# Patient Record
Sex: Female | Born: 1945 | Race: Black or African American | Hispanic: No | Marital: Married | Smoking: Former smoker
Health system: Southern US, Community
[De-identification: ages and names within clinical notes are randomized; demographics above are authoritative.]

## PROBLEM LIST (undated history)

## (undated) DIAGNOSIS — K219 Gastro-esophageal reflux disease without esophagitis: Secondary | ICD-10-CM

## (undated) DIAGNOSIS — R269 Unspecified abnormalities of gait and mobility: Secondary | ICD-10-CM

## (undated) DIAGNOSIS — E119 Type 2 diabetes mellitus without complications: Secondary | ICD-10-CM

## (undated) DIAGNOSIS — I10 Essential (primary) hypertension: Secondary | ICD-10-CM

## (undated) DIAGNOSIS — F329 Major depressive disorder, single episode, unspecified: Secondary | ICD-10-CM

## (undated) DIAGNOSIS — M1990 Unspecified osteoarthritis, unspecified site: Secondary | ICD-10-CM

## (undated) DIAGNOSIS — G629 Polyneuropathy, unspecified: Secondary | ICD-10-CM

## (undated) DIAGNOSIS — G373 Acute transverse myelitis in demyelinating disease of central nervous system: Secondary | ICD-10-CM

## (undated) DIAGNOSIS — F32A Depression, unspecified: Secondary | ICD-10-CM

## (undated) DIAGNOSIS — E669 Obesity, unspecified: Secondary | ICD-10-CM

## (undated) DIAGNOSIS — E785 Hyperlipidemia, unspecified: Secondary | ICD-10-CM

## (undated) DIAGNOSIS — R55 Syncope and collapse: Secondary | ICD-10-CM

## (undated) DIAGNOSIS — I2510 Atherosclerotic heart disease of native coronary artery without angina pectoris: Secondary | ICD-10-CM

## (undated) HISTORY — PX: OTHER SURGICAL HISTORY: SHX169

## (undated) HISTORY — DX: Unspecified osteoarthritis, unspecified site: M19.90

## (undated) HISTORY — DX: Polyneuropathy, unspecified: G62.9

## (undated) HISTORY — PX: BLADDER REPAIR: SHX76

## (undated) HISTORY — PX: REFRACTIVE SURGERY: SHX103

## (undated) HISTORY — DX: Syncope and collapse: R55

## (undated) HISTORY — DX: Essential (primary) hypertension: I10

## (undated) HISTORY — DX: Type 2 diabetes mellitus without complications: E11.9

## (undated) HISTORY — PX: CATARACT EXTRACTION: SUR2

## (undated) HISTORY — PX: TUMOR REMOVAL: SHX12

## (undated) HISTORY — PX: BREAST LUMPECTOMY: SHX2

## (undated) HISTORY — DX: Unspecified abnormalities of gait and mobility: R26.9

## (undated) HISTORY — DX: Gastro-esophageal reflux disease without esophagitis: K21.9

## (undated) HISTORY — DX: Acute transverse myelitis in demyelinating disease of central nervous system: G37.3

## (undated) HISTORY — DX: Major depressive disorder, single episode, unspecified: F32.9

## (undated) HISTORY — DX: Hyperlipidemia, unspecified: E78.5

## (undated) HISTORY — DX: Depression, unspecified: F32.A

## (undated) HISTORY — DX: Atherosclerotic heart disease of native coronary artery without angina pectoris: I25.10

## (undated) HISTORY — DX: Obesity, unspecified: E66.9

## (undated) HISTORY — PX: TOTAL HIP ARTHROPLASTY: SHX124

## (undated) HISTORY — PX: CARPAL TUNNEL RELEASE: SHX101

---

## 2008-07-21 ENCOUNTER — Encounter: Admission: RE | Admit: 2008-07-21 | Discharge: 2008-07-21 | Payer: Self-pay | Admitting: Neurology

## 2008-09-29 ENCOUNTER — Ambulatory Visit: Payer: Self-pay | Admitting: Urology

## 2008-10-13 ENCOUNTER — Ambulatory Visit: Payer: Self-pay | Admitting: Urology

## 2008-11-10 ENCOUNTER — Ambulatory Visit: Payer: Self-pay | Admitting: Urology

## 2008-11-24 ENCOUNTER — Ambulatory Visit: Payer: Self-pay | Admitting: Urology

## 2009-03-23 ENCOUNTER — Ambulatory Visit: Payer: Self-pay | Admitting: Gastroenterology

## 2009-07-20 ENCOUNTER — Ambulatory Visit: Payer: Self-pay | Admitting: Internal Medicine

## 2010-05-11 ENCOUNTER — Ambulatory Visit (HOSPITAL_BASED_OUTPATIENT_CLINIC_OR_DEPARTMENT_OTHER): Admission: RE | Admit: 2010-05-11 | Discharge: 2010-05-11 | Payer: Self-pay | Admitting: Orthopedic Surgery

## 2010-08-18 ENCOUNTER — Encounter: Payer: Self-pay | Admitting: Cardiovascular Disease

## 2010-09-27 DIAGNOSIS — R55 Syncope and collapse: Secondary | ICD-10-CM | POA: Insufficient documentation

## 2010-10-04 ENCOUNTER — Encounter: Payer: Self-pay | Admitting: Cardiovascular Disease

## 2010-10-04 ENCOUNTER — Ambulatory Visit: Payer: Self-pay | Admitting: Cardiovascular Disease

## 2010-10-04 DIAGNOSIS — I2510 Atherosclerotic heart disease of native coronary artery without angina pectoris: Secondary | ICD-10-CM

## 2010-10-04 DIAGNOSIS — I10 Essential (primary) hypertension: Secondary | ICD-10-CM

## 2010-10-18 ENCOUNTER — Ambulatory Visit: Payer: PRIVATE HEALTH INSURANCE | Admitting: Internal Medicine

## 2010-12-06 NOTE — Assessment & Plan Note (Signed)
Summary: syncope/mt   Visit Type:  new pt visit Referring Provider:  Dr. Renae Fickle Primary Provider:  Yates Decamp  CC:  syncope episode in [place] while visiting....weakness before syncope episode...edema/ankles/hands...denies any cp ...does have some sob at times.  History of Present Illness: 65 yo AAF  with h/o HTN, HL, DM here today for evaluation of syncope. She was visiting her daughter in [place] on August 16, 2010 and was walking down hall when she fell and lost consciousness. She was admitted and ruled out for an MI. A nuclear stress test showed possible inferior wall ischemia. Echo showed normal LV function. Cardiac cath with 50-60% mid LAD lesion with FFR of 0.85.  She was released without any intervention. She tells me that she has had no chest pain. She does have occasional SOB and arm weakness and fatigue. She thinks she took an extra BP pill the day she passed out.   Records reviiewed. I only have a discharge summary from [place], [place]. Cath with 50-60% LAD stenosis and FFR of 0.85. EF normal. Echo with normal EF.   Current Medications (verified): 1)  Metformin Hcl 500 Mg Tabs (Metformin Hcl) .Marland Kitchen.. 1 Tab Two Times A Day 2)  Nexium 40 Mg Cpdr (Esomeprazole Magnesium) .Marland Kitchen.. 1 Cap Once Daily 3)  Hydrocodone-Acetaminophen 7.5-500 Mg Tabs (Hydrocodone-Acetaminophen) .Marland Kitchen.. 1 Tab Four Times Daily 4)  Sertraline Hcl 50 Mg Tabs (Sertraline Hcl) .Marland Kitchen.. 1 1/2 Tab Once Daily 5)  Gabapentin 800 Mg Tabs (Gabapentin) .Marland Kitchen.. 1 Tab Three Times A Day 6)  Ranitidine Hcl 150 Mg Caps (Ranitidine Hcl) .Marland Kitchen.. 1 Cap Once Daily 7)  Furosemide 40 Mg Tabs (Furosemide) .Marland Kitchen.. 1 Tab Once Daily 8)  Baclofen 20 Mg Tabs (Baclofen) .Marland Kitchen.. 1 Tab Four Times Daily 9)  Dantrium 100 Mg Caps (Dantrolene Sodium) .Marland Kitchen.. 1 Cap Three Times A Day 10)  Metoclopramide Hcl 10 Mg Tabs (Metoclopramide Hcl) .Marland Kitchen.. 1 Tab Once Daily 11)  Lantus 100 Unit/ml Soln (Insulin Glargine) .... 30 Units At Bedtime 12)  Humulin 70/30 70-30 %  Susp (Insulin Isophane & Regular) .Marland Kitchen.. 10 Units As Needed 13)  Pravastatin Sodium 40 Mg Tabs (Pravastatin Sodium) .Marland Kitchen.. 1 Tab At Bedtime 14)  Lisinopril 20 Mg Tabs (Lisinopril) .Marland Kitchen.. 1 Tab Once Daily 15)  Aspirin 81 Mg Tbec (Aspirin) .... Take One Tablet By Mouth Daily 16)  Vitamin E 1000 Unit Caps (Vitamin E) .Marland Kitchen.. 1 Cap 3 Times Weekly 17)  Colace 100 Mg Caps (Docusate Sodium) .... 2 Cap Once Daily  Allergies (verified): No Known Drug Allergies  Past History:  Past Medical History: SYNCOPE  CAD HTN Hyperlipidemia DM Diabetic retinopathy GERD Transverse myelitis  Past Surgical History: Breast tumor removed 1966 Carpal tunnel on right wrist and left elbow 2011 Laser eye surgery both eyes Cataract surgery on the left eye Bilateral tubal ligation  Family History: Reviewed history and no changes required. Mother-deceased, age 18 CVA Father-deceased age 66 CVA  3 sisters alive and one deceased from [place] 1 brother alive and one deceased  Social History: Reviewed history and no changes required. Remote tobacco abuse but stopped 1989 No alcohol No illiicit drug use Married, 2 children Disability  Review of Systems       The patient complains of fatigue, shortness of breath, fainting, and dizziness.  The patient denies malaise, fever, weight gain/loss, vision loss, decreased hearing, hoarseness, chest pain, palpitations, prolonged cough, wheezing, sleep apnea, coughing up blood, abdominal pain, blood in stool, nausea, vomiting, diarrhea, heartburn, incontinence, blood in urine, muscle  weakness, joint pain, leg swelling, rash, skin lesions, headache, depression, anxiety, enlarged lymph nodes, easy bruising or bleeding, and environmental allergies.    Vital Signs:  Patient profile:   65 year old female Height:      62 inches Weight:      170.75 pounds BMI:     31.34 Pulse rate:   59 / minute Pulse rhythm:   irregular BP sitting:   112 / 66  (left arm) Cuff size:    large  Vitals Entered By: Danielle Rankin, CMA (October 04, 2010 3:27 PM)  Physical Exam  General:  General: Well developed, well nourished, NAD HEENT: OP clear, mucus membranes moist SKIN: warm, dry Neuro: No focal deficits Musculoskeletal: Muscle strength 5/5 all ext Psychiatric: Mood and affect normal Neck: No JVD, no carotid bruits, no thyromegaly, no lymphadenopathy. Lungs:Clear bilaterally, no wheezes, rhonci, crackles CV: RRR no murmurs, gallops rubs Abdomen: soft, NT, ND, BS present Extremities: No edema, pulses 2+.    EKG  Procedure date:  10/04/2010  Findings:      Sinus brady, rate 59 bpm. One pause. Poor R wave progression through precordial leads.  Impression & Recommendations:  Problem # 1:  SYNCOPE (ICD-780.2) No recurrence. She does have bradycardia with short pause on EKG today. If she has recurrent syncope, will need Holter monitor.  No carotid bruits on exam.   Her updated medication list for this problem includes:    Lisinopril 20 Mg Tabs (Lisinopril) .Marland Kitchen... 1 tab once daily    Aspirin 81 Mg Tbec (Aspirin) .Marland Kitchen... Take one tablet by mouth daily  Orders: EKG w/ Interpretation (93000)  Problem # 2:  CAD, NATIVE VESSEL (ICD-414.01) Stable. Continue ASA, statin, Ace-inh. She will not tolerate a beta blocker secondary to bradycardia.   Her updated medication list for this problem includes:    Lisinopril 20 Mg Tabs (Lisinopril) .Marland Kitchen... 1 tab once daily    Aspirin 81 Mg Tbec (Aspirin) .Marland Kitchen... Take one tablet by mouth daily  Problem # 3:  HYPERTENSION, BENIGN (ICD-401.1) BP well controlled.   Her updated medication list for this problem includes:    Furosemide 40 Mg Tabs (Furosemide) .Marland Kitchen... 1 tab once daily    Lisinopril 20 Mg Tabs (Lisinopril) .Marland Kitchen... 1 tab once daily    Aspirin 81 Mg Tbec (Aspirin) .Marland Kitchen... Take one tablet by mouth daily  Patient Instructions: 1)  Your physician recommends that you schedule a follow-up appointment in: 6 months. 2)  Your  physician recommends that you continue on your current medications as directed. Please refer to the Current Medication list given to you today.

## 2010-12-08 NOTE — Letter (Signed)
Summary: [facility] - Discharge Summary  [facility] - Discharge Summary   Imported By: Marylou Mccoy 10/19/2010 10:10:57  _____________________________________________________________________  External Attachment:    Type:   Image     Comment:   External Document

## 2011-01-16 ENCOUNTER — Ambulatory Visit: Payer: PRIVATE HEALTH INSURANCE | Admitting: Ophthalmology

## 2011-01-16 DIAGNOSIS — I10 Essential (primary) hypertension: Secondary | ICD-10-CM

## 2011-01-22 LAB — GLUCOSE, CAPILLARY: Glucose-Capillary: 136 mg/dL — ABNORMAL HIGH (ref 70–99)

## 2011-01-22 LAB — BASIC METABOLIC PANEL
BUN: 20 mg/dL (ref 6–23)
Calcium: 9.4 mg/dL (ref 8.4–10.5)
Creatinine, Ser: 0.8 mg/dL (ref 0.4–1.2)
GFR calc Af Amer: >60 mL/min (ref >60)
GFR calc non Af Amer: >60 mL/min (ref >60)

## 2011-01-24 ENCOUNTER — Ambulatory Visit: Payer: PRIVATE HEALTH INSURANCE | Admitting: Ophthalmology

## 2011-03-20 ENCOUNTER — Encounter: Payer: PRIVATE HEALTH INSURANCE | Admitting: Neurology

## 2011-04-07 ENCOUNTER — Encounter: Payer: PRIVATE HEALTH INSURANCE | Admitting: Neurology

## 2011-05-07 ENCOUNTER — Encounter: Payer: PRIVATE HEALTH INSURANCE | Admitting: Neurology

## 2011-07-26 ENCOUNTER — Encounter: Payer: Self-pay | Admitting: Cardiovascular Disease

## 2011-07-27 ENCOUNTER — Encounter: Payer: Self-pay | Admitting: Cardiovascular Disease

## 2011-07-27 ENCOUNTER — Ambulatory Visit (INDEPENDENT_AMBULATORY_CARE_PROVIDER_SITE_OTHER): Payer: Medicare Other | Admitting: Cardiovascular Disease

## 2011-07-27 VITALS — BP 130/60 | HR 68 | Ht 62.0 in

## 2011-07-27 DIAGNOSIS — I10 Essential (primary) hypertension: Secondary | ICD-10-CM

## 2011-07-27 DIAGNOSIS — I2510 Atherosclerotic heart disease of native coronary artery without angina pectoris: Secondary | ICD-10-CM

## 2011-07-27 NOTE — Progress Notes (Signed)
History of Present Illness:65  yo AAF  with h/o HTN, HL, DM here today for follow up. I saw her as a new pt in November 2011 for evaluation of syncope. She was visiting her daughter in [place] on August 16, 2010 and was walking down hall when she fell and lost consciousness. She was admitted and ruled out for an MI. A nuclear stress test showed possible inferior wall ischemia. Echo showed normal LV function. Cardiac cath with 50-60% mid LAD lesion with FFR of 0.85.  She was released without any intervention.   She tells me that she has had no chest pain or SOB. She is mostly in a wheelchair because of transverse myelitis. No dizziness, near syncope or  syncope. No palpitations.   Her primary care is Dr. Yates Decamp in the [facility] in [place].   Past Medical History  Diagnosis Date  . Syncope   . CAD (coronary artery disease)     Cath October 2011 [place], [place]-50-60% mid LAD, FFR 0.85.   Marland Kitchen HTN (hypertension)   . Hyperlipidemia   . DM (diabetes mellitus)   . Diabetic retinopathy   . GERD (gastroesophageal reflux disease)   . Transverse myelitis     Past Surgical History  Procedure Date  . Tumor removal     breast  . Carpal tunnel release     right wrist and left elbow 2011  . Refractive surgery     both eyes  . Cataract extraction     left eye  . Bilateral tubal ligation     Current Outpatient Prescriptions  Medication Sig Dispense Refill  . aspirin 81 MG tablet Take 81 mg by mouth daily.        . dantrolene (DANTRIUM) 100 MG capsule Take 100 mg by mouth 3 (three) times daily.       Marland Kitchen docusate sodium (COLACE) 100 MG capsule Take 100 mg by mouth 2 (two) times daily.        . furosemide (LASIX) 40 MG tablet Take 40 mg by mouth daily.        Marland Kitchen gabapentin (NEURONTIN) 800 MG tablet Take 800 mg by mouth 3 (three) times daily.        . insulin glargine (LANTUS) 100 UNIT/ML injection Inject 30 Units into the skin at bedtime.        Marland Kitchen lisinopril (PRINIVIL,ZESTRIL) 20  MG tablet Take 20 mg by mouth daily.        . metoCLOPramide (REGLAN) 10 MG tablet Take 10 mg by mouth daily.        . nitroGLYCERIN (NITROSTAT) 0.4 MG SL tablet Place 0.4 mg under the tongue every 5 (five) minutes as needed.        . pravastatin (PRAVACHOL) 40 MG tablet Take 40 mg by mouth daily.        . sertraline (ZOLOFT) 50 MG tablet Take 50 mg by mouth daily. Taking a pill and a half Daily      . vitamin E 1000 UNIT capsule Take 1,000 Units by mouth 3 (three) times a week.        . baclofen (LIORESAL) 20 MG tablet Take 20 mg by mouth 3 (three) times daily.        . metFORMIN (GLUCOPHAGE) 500 MG tablet Take 500 mg by mouth 2 (two) times daily with a meal.          Allergies not on file  History   Social History  . Marital Status: Married  Spouse Name: N/A    Number of Children: 2  . Years of Education: N/A   Occupational History  . Not on file.   Social History Main Topics  . Smoking status: Former Smoker    Types: Cigarettes    Quit date: 11/07/1987  . Smokeless tobacco: Not on file  . Alcohol Use: No  . Drug Use: No  . Sexually Active: Not on file   Other Topics Concern  . Not on file   Social History Narrative  . No narrative on file    Family History  Problem Relation Age of Onset  . Stroke Mother   . Stroke Father     Review of Systems:  As stated in the HPI and otherwise negative.   BP 130/60  Pulse 68  Ht 5\' 2"  (1.575 m)  Physical Examination: General: Well developed, well nourished, NAD HEENT: OP clear, mucus membranes moist SKIN: warm, dry. No rashes. Neuro: No focal deficits Musculoskeletal: Muscle strength 5/5 all ext Psychiatric: Mood and affect normal Neck: No JVD, no carotid bruits, no thyromegaly, no lymphadenopathy. Lungs:Clear bilaterally, no wheezes, rhonci, crackles Cardiovascular: Regular rate and rhythm. No murmurs, gallops or rubs. Abdomen:Soft. Bowel sounds present. Non-tender.  Extremities: No lower extremity edema. Pulses  are 2 + in the bilateral DP/PT.  EKG:NSR, rate 70 bpm. Poor R wave progression through the precordial leads.

## 2011-07-27 NOTE — Assessment & Plan Note (Signed)
BP stable. No changes 

## 2011-07-27 NOTE — Assessment & Plan Note (Signed)
Stable moderate LAD disease. No chest pain. EKG unchanges. Continue medical management with ASA, statin. Can add beta blocker in future. She will call if changes.

## 2011-07-27 NOTE — Patient Instructions (Signed)
Your physician wants you to follow-up in:  12 months.  You will receive a reminder letter in the mail two months in advance. If you don't receive a letter, please call our office to schedule the follow-up appointment.   

## 2011-09-06 ENCOUNTER — Other Ambulatory Visit: Payer: Self-pay | Admitting: Neurology

## 2011-09-06 DIAGNOSIS — G992 Myelopathy in diseases classified elsewhere: Secondary | ICD-10-CM

## 2011-09-06 DIAGNOSIS — R269 Unspecified abnormalities of gait and mobility: Secondary | ICD-10-CM

## 2011-09-11 ENCOUNTER — Ambulatory Visit
Admission: RE | Admit: 2011-09-11 | Discharge: 2011-09-11 | Disposition: A | Discharge status: Home / Self Care | Payer: Medicare Other | Source: Ambulatory Visit | Attending: Neurology | Admitting: Neurology

## 2011-09-11 DIAGNOSIS — R269 Unspecified abnormalities of gait and mobility: Secondary | ICD-10-CM

## 2011-09-11 DIAGNOSIS — G992 Myelopathy in diseases classified elsewhere: Secondary | ICD-10-CM

## 2011-11-14 ENCOUNTER — Ambulatory Visit: Payer: Self-pay | Admitting: Internal Medicine

## 2011-12-12 ENCOUNTER — Ambulatory Visit
Admission: RE | Admit: 2011-12-12 | Discharge: 2011-12-12 | Disposition: A | Discharge status: Home / Self Care | Payer: Medicare Other | Source: Ambulatory Visit | Attending: Neurology | Admitting: Neurology

## 2011-12-12 ENCOUNTER — Other Ambulatory Visit: Payer: Self-pay | Admitting: Neurology

## 2011-12-12 DIAGNOSIS — M545 Low back pain, unspecified: Secondary | ICD-10-CM

## 2011-12-12 DIAGNOSIS — M25552 Pain in left hip: Secondary | ICD-10-CM

## 2011-12-12 DIAGNOSIS — M25551 Pain in right hip: Secondary | ICD-10-CM

## 2012-07-18 ENCOUNTER — Encounter: Payer: Self-pay | Admitting: Cardiovascular Disease

## 2012-07-18 ENCOUNTER — Ambulatory Visit (INDEPENDENT_AMBULATORY_CARE_PROVIDER_SITE_OTHER): Payer: Medicare Other | Admitting: Cardiovascular Disease

## 2012-07-18 VITALS — BP 114/67 | HR 73 | Ht 62.0 in | Wt 179.0 lb

## 2012-07-18 DIAGNOSIS — R0609 Other forms of dyspnea: Secondary | ICD-10-CM

## 2012-07-18 DIAGNOSIS — R0600 Dyspnea, unspecified: Secondary | ICD-10-CM

## 2012-07-18 DIAGNOSIS — I25810 Atherosclerosis of coronary artery bypass graft(s) without angina pectoris: Secondary | ICD-10-CM

## 2012-07-18 NOTE — Patient Instructions (Signed)
Your physician recommends that you schedule a follow-up appointment in:  4 weeks.   Your physician has requested that you have a lexiscan myoview. For further information please visit [email or web address] Please follow instruction sheet, as given.

## 2012-07-18 NOTE — Progress Notes (Signed)
History of Present Illness: 66 yo AAF with h/o HTN, HL, DM here today for follow up. I saw her as a new pt in November 2011 for evaluation of syncope. She was visiting her daughter in [place] on August 16, 2010 and was walking down hall when she fell and lost consciousness. She was admitted and ruled out for an MI. A nuclear stress test showed possible inferior wall ischemia. Echo showed normal LV function. Cardiac cath with 50-60% mid LAD lesion with FFR of 0.85. She was released without any intervention. She was last seen in our office September 2012.   She tells me that she has had no chest pain but she does describe SOB and fatigue. She has no energy. This has gotten progressively worse over the last 6 months.  She is mostly in a wheelchair because of transverse myelitis. No dizziness, near syncope or syncope. No palpitations. She has been receiving shots in her eyes for diabetic retinopathy. She had eyelid surgery last week. She is planning to have knee replacement and hip replacement surgery in the next three months.   Primary Care Physician: Dr. Yates Decamp in the [facility] in [place].   Past Medical History  Diagnosis Date  . Syncope   . CAD (coronary artery disease)     Cath October 2011 [place], -50-60% mid LAD, FFR 0.85.   Marland Kitchen HTN (hypertension)   . Hyperlipidemia   . DM (diabetes mellitus)   . Diabetic retinopathy   . GERD (gastroesophageal reflux disease)   . Transverse myelitis     Past Surgical History  Procedure Date  . Tumor removal     breast  . Carpal tunnel release     right wrist and left elbow 2011  . Refractive surgery     both eyes  . Cataract extraction     left eye  . Bilateral tubal ligation     Current Outpatient Prescriptions  Medication Sig Dispense Refill  . aspirin 81 MG tablet Take 81 mg by mouth daily.        . baclofen (LIORESAL) 20 MG tablet Take 20 mg by mouth 3 (three) times daily.        . dantrolene (DANTRIUM) 100 MG  capsule Take 100 mg by mouth 3 (three) times daily.       Marland Kitchen docusate sodium (COLACE) 100 MG capsule Take 100 mg by mouth 2 (two) times daily.        . furosemide (LASIX) 40 MG tablet Take 40 mg by mouth daily.        Marland Kitchen gabapentin (NEURONTIN) 800 MG tablet Take 800 mg by mouth 3 (three) times daily.        . insulin glargine (LANTUS) 100 UNIT/ML injection Inject 30 Units into the skin at bedtime.        . Insulin Regular Human (NOVOLIN R IJ) Inject as directed. 15 units      . lisinopril (PRINIVIL,ZESTRIL) 20 MG tablet Take 20 mg by mouth daily.        . metFORMIN (GLUCOPHAGE) 500 MG tablet Take 500 mg by mouth 2 (two) times daily with a meal.        . metoCLOPramide (REGLAN) 10 MG tablet Take 10 mg by mouth daily.        . nitroGLYCERIN (NITROSTAT) 0.4 MG SL tablet Place 0.4 mg under the tongue every 5 (five) minutes as needed.        . pravastatin (PRAVACHOL) 40 MG tablet Take 40  mg by mouth daily.        . sertraline (ZOLOFT) 50 MG tablet Take 50 mg by mouth daily. Taking a pill and a half Daily      . vitamin E 1000 UNIT capsule Take 1,000 Units by mouth 3 (three) times a week.          No Known Allergies  History   Social History  . Marital Status: Married    Spouse Name: N/A    Number of Children: 2  . Years of Education: N/A   Occupational History  . Not on file.   Social History Main Topics  . Smoking status: Former Smoker    Types: Cigarettes    Quit date: 11/07/1987  . Smokeless tobacco: Not on file  . Alcohol Use: No  . Drug Use: No  . Sexually Active: Not on file   Other Topics Concern  . Not on file   Social History Narrative  . No narrative on file    Family History  Problem Relation Age of Onset  . Stroke Mother   . Stroke Father     Review of Systems:  As stated in the HPI and otherwise negative.   BP 114/67  Pulse 73  Ht 5\' 2"  (1.575 m)  Wt 179 lb (81.194 kg)  BMI 32.74 kg/m2  Physical Examination: General: Well developed, well nourished,  NAD HEENT: OP clear, mucus membranes moist SKIN: warm, dry. No rashes. Neuro: No focal deficits Musculoskeletal: Muscle strength 5/5 all ext Psychiatric: Mood and affect normal Neck: No JVD, no carotid bruits, no thyromegaly, no lymphadenopathy. Lungs:Clear bilaterally, no wheezes, rhonci, crackles Cardiovascular: Regular rate and rhythm. No murmurs, gallops or rubs. Abdomen:Soft. Bowel sounds present. Non-tender.  Extremities: No lower extremity edema. Pulses are 2 + in the bilateral DP/PT.  EKG:NSR, rate 66 bpm. Poor R wave progression precordial leads.   Assessment and Plan:   1. CAD: She is known to have 50-60% mid LAD stenosis by cath in 2011 with negative FFR. Her stress myoview had shown possible inferior ischemia at that time. Given her dyspnea and fatigue, moderate CAD and plans for surgery, I think a stress myoview is necessary to exclude ischemia. Will arrange Steffanie Dunn since she cannot walk secondary to transverse myelitis.   2. Dyspnea: See above.   3. HTN: Well controlled. No changes

## 2012-07-25 ENCOUNTER — Ambulatory Visit (HOSPITAL_COMMUNITY): Payer: Medicare Other | Attending: Cardiology | Admitting: Radiology

## 2012-07-25 VITALS — BP 153/71 | HR 62 | Ht 62.0 in | Wt 179.0 lb

## 2012-07-25 DIAGNOSIS — I25810 Atherosclerosis of coronary artery bypass graft(s) without angina pectoris: Secondary | ICD-10-CM

## 2012-07-25 DIAGNOSIS — R0609 Other forms of dyspnea: Secondary | ICD-10-CM | POA: Insufficient documentation

## 2012-07-25 DIAGNOSIS — I10 Essential (primary) hypertension: Secondary | ICD-10-CM | POA: Insufficient documentation

## 2012-07-25 DIAGNOSIS — I2510 Atherosclerotic heart disease of native coronary artery without angina pectoris: Secondary | ICD-10-CM | POA: Insufficient documentation

## 2012-07-25 DIAGNOSIS — R0989 Other specified symptoms and signs involving the circulatory and respiratory systems: Secondary | ICD-10-CM | POA: Insufficient documentation

## 2012-07-25 DIAGNOSIS — E119 Type 2 diabetes mellitus without complications: Secondary | ICD-10-CM | POA: Insufficient documentation

## 2012-07-25 DIAGNOSIS — R42 Dizziness and giddiness: Secondary | ICD-10-CM | POA: Insufficient documentation

## 2012-07-25 DIAGNOSIS — R0602 Shortness of breath: Secondary | ICD-10-CM

## 2012-07-25 DIAGNOSIS — R55 Syncope and collapse: Secondary | ICD-10-CM | POA: Insufficient documentation

## 2012-07-25 DIAGNOSIS — R0600 Dyspnea, unspecified: Secondary | ICD-10-CM

## 2012-07-25 MED ORDER — TECHNETIUM TC 99M SESTAMIBI GENERIC - CARDIOLITE
11.0000 | Freq: Once | INTRAVENOUS | Status: AC | PRN
  Administered 2012-07-25: 11 via INTRAVENOUS

## 2012-07-25 MED ORDER — TECHNETIUM TC 99M SESTAMIBI GENERIC - CARDIOLITE
33.0000 | Freq: Once | INTRAVENOUS | Status: AC | PRN
  Administered 2012-07-25: 33 via INTRAVENOUS

## 2012-07-25 MED ORDER — REGADENOSON 0.4 MG/5ML IV SOLN
0.4000 mg | Freq: Once | INTRAVENOUS | Status: AC
  Administered 2012-07-25: 0.4 mg via INTRAVENOUS

## 2012-07-25 NOTE — Progress Notes (Signed)
[facility] SITE 3 NUCLEAR MED [address] [place] [address] [phone]  Cardiology Nuclear Med Study  Rebecca Castro is a 66 y.o. female     MRN : [id number]     DOB: Jan 16, 1946  Procedure Date: 07/25/2012  Nuclear Med Background Indication for Stress Test:  Evaluation for Ischemia and Pending TKR and THR History:  '11 [id number] inferior wall ischemia>Cath=n/o CAD; '11 Echo:Normal LVF Cardiac Risk Factors: Family History - CAD, History of Smoking, Hypertension, IDDM Type 2 and Obesity  Symptoms:  Dizziness, DOE, Fatigue and Near Syncope   Nuclear Pre-Procedure Caffeine/Decaff Intake:  None > 12 hrs NPO After: 9:30pm   Lungs:  Clear. O2 Sat: 99% on room air. IV 0.9% NS with Angio Cath:  22g  IV Site: R Antecubital x 1, tolerated well IV Started by:  Irean Hong, RN  Chest Size (in):  40 Cup Size: DD  Height: 5\' 2"  (1.575 m)  Weight:  179 lb (81.194 kg)  BMI:  Body mass index is 32.74 kg/(m^2). Tech Comments: FBS was 91 at 4:30am. No insulin today, and no lantus insulin last night per patient    Nuclear Med Study 1 or 2 day study: 1 day  Stress Test Type:  Lexiscan  Reading MD: Willa Rough, MD  Order Authorizing Provider:  Verne Carrow, MD  Resting Radionuclide: Technetium 28m Sestamibi  Resting Radionuclide Dose: 11.0 mCi   Stress Radionuclide:  Technetium 27m Sestamibi  Stress Radionuclide Dose: 33.0 mCi           Stress Protocol Rest HR: 62 Stress HR: 79  Rest BP: 153/71 Stress BP: 155/62  Exercise Time (min): n/a METS: n/a   Predicted Max HR: 155 bpm % Max HR: 50.97 bpm Rate Pressure Product: 96295   Dose of Adenosine (mg):  n/a Dose of Lexiscan: 0.4 mg  Dose of Atropine (mg): n/a Dose of Dobutamine: n/a   Stress Test Technologist: Smiley Houseman, CMA-N  Nuclear Technologist:  Domenic Polite, CNMT     Rest Procedure:  Myocardial perfusion imaging was performed at rest 45 minutes following the intravenous administration of  Technetium 83m Sestamibi.  Rest ECG: Probable prior SWMI.  Stress Procedure:  The patient received IV Lexiscan 0.4 mg over 15-seconds.  Technetium 21m Sestamibi injected at 30-seconds.  There were no significant changes with Lexiscan.  Quantitative spect images were obtained after a 45 minute delay.  Stress ECG: No significant change from baseline ECG  QPS Raw Data Images:  Patient motion noted; appropriate software correction applied. Stress Images:  Normal homogeneous uptake in all areas of the myocardium. Rest Images:  Normal homogeneous uptake in all areas of the myocardium. Subtraction (SDS):  No evidence of ischemia. Transient Ischemic Dilatation (Normal <1.22):  1.02 Lung/Heart Ratio (Normal <0.45):  0.26  Quantitative Gated Spect Images QGS EDV:  71 ml QGS ESV:  17 ml  Impression Exercise Capacity:  Lexiscan with no exercise. BP Response:  Normal blood pressure response. Clinical Symptoms:  short of breath ECG Impression:  No significant ST segment change suggestive of ischemia. Comparison with Prior Nuclear Study: No images to compare  Overall Impression:  Normal stress nuclear study.  LV Ejection Fraction: 76%.  LV Wall Motion:  Normal Wall Motion  Willa Rough, MD

## 2012-08-23 ENCOUNTER — Encounter: Payer: Self-pay | Admitting: Cardiovascular Disease

## 2012-08-23 ENCOUNTER — Ambulatory Visit (INDEPENDENT_AMBULATORY_CARE_PROVIDER_SITE_OTHER): Payer: Medicare Other | Admitting: Cardiovascular Disease

## 2012-08-23 VITALS — BP 142/72 | HR 61 | Ht 62.0 in | Wt 179.0 lb

## 2012-08-23 DIAGNOSIS — Z01810 Encounter for preprocedural cardiovascular examination: Secondary | ICD-10-CM

## 2012-08-23 DIAGNOSIS — I2510 Atherosclerotic heart disease of native coronary artery without angina pectoris: Secondary | ICD-10-CM

## 2012-08-23 NOTE — Progress Notes (Signed)
History of Present Illness: 66 yo AAF with h/o HTN, HL, DM here today for follow up. I saw her as a new pt in November 2011 for evaluation of syncope. She was visiting her daughter in [place] on August 16, 2010 and was walking down hall when she fell and lost consciousness. She was admitted and ruled out for an MI. A nuclear stress test showed possible inferior wall ischemia. Echo showed normal LV function. Cardiac cath with 50-60% mid LAD lesion with FFR of 0.85. She was released without any intervention. I saw her September 2013 and she told me that she was feeling well overall with some fatigue and SOB and had plans for knee replacement and hip replacement soon.  I arranged a Lexiscan stress myoview on 07/25/12 to exclude ischemia. Her LVEF was normal and there was no evidence of ischemia.    She is here today for follow up. No chest pain.  No dizziness, near syncope or syncope. No palpitations. She is feeling well. Only c/o hip and knee pain.   Primary Care Physician: Dr. Yates Decamp in the [facility] in [place].  Orthopedic Surgeon: Dr. Rosita Kea  [facility])  Last Lipid Profile: Followed in primary care.    Past Medical History  Diagnosis Date  . Syncope   . CAD (coronary artery disease)     Cath October 2011 [place], [place]-50-60% mid LAD, FFR 0.85.   Marland Kitchen HTN (hypertension)   . Hyperlipidemia   . DM (diabetes mellitus)   . Diabetic retinopathy(362.0)   . GERD (gastroesophageal reflux disease)   . Transverse myelitis     Past Surgical History  Procedure Date  . Tumor removal     breast  . Carpal tunnel release     right wrist and left elbow 2011  . Refractive surgery     both eyes  . Cataract extraction     left eye  . Bilateral tubal ligation     Current Outpatient Prescriptions  Medication Sig Dispense Refill  . aspirin 81 MG tablet Take 81 mg by mouth daily.        . baclofen (LIORESAL) 20 MG tablet Take 20 mg by mouth 3 (three) times daily.        . dantrolene  (DANTRIUM) 100 MG capsule Take 100 mg by mouth 3 (three) times daily.       Marland Kitchen docusate sodium (COLACE) 100 MG capsule Take 100 mg by mouth 2 (two) times daily.        . furosemide (LASIX) 40 MG tablet Take 40 mg by mouth daily.        Marland Kitchen gabapentin (NEURONTIN) 800 MG tablet Take 800 mg by mouth 3 (three) times daily.        . insulin glargine (LANTUS) 100 UNIT/ML injection Inject 30 Units into the skin at bedtime.        . Insulin Regular Human (NOVOLIN R IJ) Inject as directed. 15 units      . lisinopril (PRINIVIL,ZESTRIL) 20 MG tablet Take 20 mg by mouth daily.        . metFORMIN (GLUCOPHAGE) 500 MG tablet Take 500 mg by mouth 2 (two) times daily with a meal.        . metoCLOPramide (REGLAN) 10 MG tablet Take 10 mg by mouth daily.        . nitroGLYCERIN (NITROSTAT) 0.4 MG SL tablet Place 0.4 mg under the tongue every 5 (five) minutes as needed.        Marland Kitchen  pravastatin (PRAVACHOL) 40 MG tablet Take 40 mg by mouth daily.        . sertraline (ZOLOFT) 50 MG tablet Take 50 mg by mouth daily. Taking a pill and a half Daily      . vitamin E 1000 UNIT capsule Take 1,000 Units by mouth 3 (three) times a week.          No Known Allergies  History   Social History  . Marital Status: Married    Spouse Name: N/A    Number of Children: 2  . Years of Education: N/A   Occupational History  . Not on file.   Social History Main Topics  . Smoking status: Former Smoker    Types: Cigarettes    Quit date: 11/07/1987  . Smokeless tobacco: Not on file  . Alcohol Use: No  . Drug Use: No  . Sexually Active: Not on file   Other Topics Concern  . Not on file   Social History Narrative  . No narrative on file    Family History  Problem Relation Age of Onset  . Stroke Mother   . Stroke Father     Review of Systems:  As stated in the HPI and otherwise negative.   BP 142/72  Pulse 61  Ht 5\' 2"  (1.575 m)  Wt 179 lb (81.194 kg)  BMI 32.74 kg/m2  Physical Examination: General: Well developed,  well nourished, NAD HEENT: OP clear, mucus membranes moist SKIN: warm, dry. No rashes. Neuro: No focal deficits Musculoskeletal: Muscle strength 5/5 all ext Psychiatric: Mood and affect normal Neck: No JVD, no carotid bruits, no thyromegaly, no lymphadenopathy. Lungs:Clear bilaterally, no wheezes, rhonci, crackles Cardiovascular: Regular rate and rhythm. No murmurs, gallops or rubs. Abdomen:Soft. Bowel sounds present. Non-tender.  Extremities: No lower extremity edema. Pulses are 2 + in the bilateral DP/PT.   Lexiscan stress myoview 07/25/12:  Stress Procedure: The patient received IV Lexiscan 0.4 mg over 15-seconds. Technetium 22m Sestamibi injected at 30-seconds. There were no significant changes with Lexiscan. Quantitative spect images were obtained after a 45 minute delay.  Stress ECG: No significant change from baseline ECG  QPS  Raw Data Images: Patient motion noted; appropriate software correction applied.  Stress Images: Normal homogeneous uptake in all areas of the myocardium.  Rest Images: Normal homogeneous uptake in all areas of the myocardium.  Subtraction (SDS): No evidence of ischemia.  Transient Ischemic Dilatation (Normal <1.22): 1.02  Lung/Heart Ratio (Normal <0.45): 0.26  Quantitative Gated Spect Images  QGS EDV: 71 ml  QGS ESV: 17 ml  Impression  Exercise Capacity: Lexiscan with no exercise.  BP Response: Normal blood pressure response.  Clinical Symptoms: short of breath  ECG Impression: No significant ST segment change suggestive of ischemia.  Comparison with Prior Nuclear Study: No images to compare  Overall Impression: Normal stress nuclear study.  LV Ejection Fraction: 76%. LV Wall Motion: Normal Wall Motion   Assessment and Plan:   1. CAD: She is known to have 50-60% mid LAD stenosis by cath in 2011 with negative FFR. Her stress myoview in September 2013 did not show ischemia.  Continue medical therapy. No further cardiac workup before planned surgical  procedures.   2. Pre-operative cardiovascular examination: See above. No further cardiac workup before planned surgical procedures.   3. HTN: Well controlled. No changes

## 2012-08-23 NOTE — Patient Instructions (Addendum)
Your physician wants you to follow-up in:  12 months.  You will receive a reminder letter in the mail two months in advance. If you don't receive a letter, please call our office to schedule the follow-up appointment.   

## 2012-08-26 ENCOUNTER — Ambulatory Visit: Payer: Self-pay | Admitting: Orthopedic Surgery

## 2012-08-26 LAB — BASIC METABOLIC PANEL
Anion Gap: 8 (ref 7–16)
BUN: 13 mg/dL (ref 7–18)
Calcium, Total: 8.8 mg/dL (ref 8.5–10.1)
Chloride: 108 mmol/L — ABNORMAL HIGH (ref 98–107)
Co2: 31 mmol/L (ref 21–32)
EGFR (African American): >60
Osmolality: 290 (ref 275–301)

## 2012-08-26 LAB — MRSA PCR SCREENING

## 2012-08-26 LAB — SEDIMENTATION RATE: Erythrocyte Sed Rate: 33 mm/hr — ABNORMAL HIGH (ref 0–30)

## 2012-08-26 LAB — CBC
HCT: 35.6 % (ref 35.0–47.0)
HGB: 11.8 g/dL — ABNORMAL LOW (ref 12.0–16.0)
MCH: 31.2 pg (ref 26.0–34.0)
MCHC: 33.1 g/dL (ref 32.0–36.0)
MCV: 94 fL (ref 80–100)
Platelet: 232 10*3/uL (ref 150–440)
RBC: 3.78 10*6/uL — ABNORMAL LOW (ref 3.80–5.20)
WBC: 6.6 10*3/uL (ref 3.6–11.0)

## 2012-08-26 LAB — APTT: Activated PTT: 35.3 secs (ref 23.6–35.9)

## 2012-08-26 LAB — PROTIME-INR
INR: 1
Prothrombin Time: 13.4 secs (ref 11.5–14.7)

## 2012-09-04 DIAGNOSIS — D518 Other vitamin B12 deficiency anemias: Secondary | ICD-10-CM | POA: Insufficient documentation

## 2012-09-04 DIAGNOSIS — G63 Polyneuropathy in diseases classified elsewhere: Secondary | ICD-10-CM | POA: Insufficient documentation

## 2012-09-04 DIAGNOSIS — M2550 Pain in unspecified joint: Secondary | ICD-10-CM | POA: Insufficient documentation

## 2012-09-04 DIAGNOSIS — IMO0001 Reserved for inherently not codable concepts without codable children: Secondary | ICD-10-CM | POA: Insufficient documentation

## 2012-09-04 DIAGNOSIS — R6889 Other general symptoms and signs: Secondary | ICD-10-CM | POA: Insufficient documentation

## 2012-09-04 DIAGNOSIS — Z5181 Encounter for therapeutic drug level monitoring: Secondary | ICD-10-CM | POA: Insufficient documentation

## 2012-09-04 DIAGNOSIS — R269 Unspecified abnormalities of gait and mobility: Secondary | ICD-10-CM | POA: Insufficient documentation

## 2012-09-04 DIAGNOSIS — G5600 Carpal tunnel syndrome, unspecified upper limb: Secondary | ICD-10-CM | POA: Insufficient documentation

## 2012-09-04 DIAGNOSIS — G992 Myelopathy in diseases classified elsewhere: Secondary | ICD-10-CM | POA: Insufficient documentation

## 2012-09-12 ENCOUNTER — Inpatient Hospital Stay: Payer: Self-pay | Admitting: Orthopedic Surgery

## 2012-09-13 LAB — BASIC METABOLIC PANEL
Anion Gap: 12 (ref 7–16)
Anion Gap: 8 (ref 7–16)
BUN: 10 mg/dL (ref 7–18)
Calcium, Total: 7.9 mg/dL — ABNORMAL LOW (ref 8.5–10.1)
Calcium, Total: 8.1 mg/dL — ABNORMAL LOW (ref 8.5–10.1)
Chloride: 105 mmol/L (ref 98–107)
Chloride: 101 mmol/L (ref 98–107)
Co2: 25 mmol/L (ref 21–32)
Co2: 28 mmol/L (ref 21–32)
Creatinine: 0.76 mg/dL (ref 0.60–1.30)
EGFR (African American): >60
EGFR (Non-African Amer.): >60
Glucose: 120 mg/dL — ABNORMAL HIGH (ref 65–99)
Osmolality: 282 (ref 275–301)
Potassium: 3.6 mmol/L (ref 3.5–5.1)
Sodium: 142 mmol/L (ref 136–145)
Sodium: 137 mmol/L (ref 136–145)

## 2012-09-13 LAB — CBC WITH DIFFERENTIAL/PLATELET
Basophil #: 0 10*3/uL (ref 0.0–0.1)
Basophil %: 0.4 %
Eosinophil %: 0.3 %
HGB: 8.8 g/dL — ABNORMAL LOW (ref 12.0–16.0)
Lymphocyte %: 10.9 %
MCH: 31.6 pg (ref 26.0–34.0)
MCV: 93 fL (ref 80–100)
Monocyte #: 1.5 x10 3/mm — ABNORMAL HIGH (ref 0.2–0.9)
Neutrophil %: 75.1 %
RBC: 2.78 10*6/uL — ABNORMAL LOW (ref 3.80–5.20)

## 2012-09-13 LAB — URINALYSIS, COMPLETE
Bilirubin,UR: NEGATIVE
Ketone: NEGATIVE
Leukocyte Esterase: NEGATIVE
Ph: 6 (ref 4.5–8.0)
Protein: NEGATIVE
RBC,UR: 1 /HPF (ref 0–5)
Specific Gravity: 1.003 (ref 1.003–1.030)
Squamous Epithelial: 1
WBC UR: <1 /HPF (ref 0–5)

## 2012-09-13 LAB — HEMOGLOBIN: HGB: 9.4 g/dL — ABNORMAL LOW (ref 12.0–16.0)

## 2012-09-14 LAB — HEMOGLOBIN: HGB: 8.2 g/dL — ABNORMAL LOW (ref 12.0–16.0)

## 2012-09-15 LAB — URINALYSIS, COMPLETE
Bilirubin,UR: NEGATIVE
Blood: NEGATIVE
Glucose,UR: NEGATIVE mg/dL (ref 0–75)
Nitrite: NEGATIVE
RBC,UR: 1 /HPF (ref 0–5)
Specific Gravity: 1.006 (ref 1.003–1.030)
WBC UR: 2 /HPF (ref 0–5)

## 2012-09-15 LAB — HEMOGLOBIN: HGB: 7.9 g/dL — ABNORMAL LOW (ref 12.0–16.0)

## 2012-09-16 LAB — HEMOGLOBIN: HGB: 9.4 g/dL — ABNORMAL LOW (ref 12.0–16.0)

## 2012-09-16 LAB — PATHOLOGY REPORT

## 2012-09-17 LAB — CBC WITH DIFFERENTIAL/PLATELET
Basophil #: 0 10*3/uL (ref 0.0–0.1)
Basophil %: 0.3 %
Eosinophil %: 1.6 %
HGB: 8.9 g/dL — ABNORMAL LOW (ref 12.0–16.0)
Lymphocyte #: 0.9 10*3/uL — ABNORMAL LOW (ref 1.0–3.6)
Lymphocyte %: 8.5 %
MCHC: 34.9 g/dL (ref 32.0–36.0)
Neutrophil %: 76.9 %
Platelet: 272 10*3/uL (ref 150–440)
RBC: 2.82 10*6/uL — ABNORMAL LOW (ref 3.80–5.20)
WBC: 10.4 10*3/uL (ref 3.6–11.0)

## 2012-09-17 LAB — COMPREHENSIVE METABOLIC PANEL
Albumin: 1.9 g/dL — ABNORMAL LOW (ref 3.4–5.0)
Alkaline Phosphatase: 112 U/L (ref 50–136)
BUN: 9 mg/dL (ref 7–18)
Calcium, Total: 8.2 mg/dL — ABNORMAL LOW (ref 8.5–10.1)
Chloride: 102 mmol/L (ref 98–107)
Co2: 30 mmol/L (ref 21–32)
Creatinine: 0.59 mg/dL — ABNORMAL LOW (ref 0.60–1.30)
Potassium: 2.9 mmol/L — ABNORMAL LOW (ref 3.5–5.1)
SGPT (ALT): 22 U/L (ref 12–78)
Sodium: 141 mmol/L (ref 136–145)
Total Protein: 6 g/dL — ABNORMAL LOW (ref 6.4–8.2)

## 2012-09-17 LAB — CREATININE, SERUM
Creatinine: 0.57 mg/dL — ABNORMAL LOW (ref 0.60–1.30)
EGFR (African American): >60

## 2012-09-17 LAB — URINE CULTURE

## 2012-09-17 LAB — SEDIMENTATION RATE: Erythrocyte Sed Rate: >140 mm/hr — ABNORMAL HIGH (ref 0–30)

## 2012-09-18 LAB — CLOSTRIDIUM DIFFICILE BY PCR

## 2012-09-19 LAB — CLOSTRIDIUM DIFFICILE BY PCR

## 2012-09-19 LAB — CULTURE, BLOOD (SINGLE)

## 2012-09-20 LAB — STOOL CULTURE

## 2012-09-21 LAB — CULTURE, BLOOD (SINGLE)

## 2012-09-22 LAB — BASIC METABOLIC PANEL
Anion Gap: 8 (ref 7–16)
Chloride: 107 mmol/L (ref 98–107)
Co2: 27 mmol/L (ref 21–32)
EGFR (African American): >60
EGFR (Non-African Amer.): >60
Glucose: 209 mg/dL — ABNORMAL HIGH (ref 65–99)
Potassium: 3.6 mmol/L (ref 3.5–5.1)
Sodium: 142 mmol/L (ref 136–145)

## 2012-09-22 LAB — WOUND CULTURE

## 2012-09-22 LAB — HEMOGLOBIN: HGB: 9 g/dL — ABNORMAL LOW (ref 12.0–16.0)

## 2012-09-24 ENCOUNTER — Encounter: Payer: Self-pay | Admitting: Internal Medicine

## 2012-09-24 LAB — WOUND AEROBIC CULTURE

## 2012-09-24 LAB — WOUND CULTURE

## 2012-09-25 LAB — WOUND CULTURE

## 2012-10-06 ENCOUNTER — Encounter: Payer: Self-pay | Admitting: Internal Medicine

## 2012-10-24 LAB — SEDIMENTATION RATE: Erythrocyte Sed Rate: 45 mm/hr — ABNORMAL HIGH (ref 0–30)

## 2012-12-30 ENCOUNTER — Encounter: Payer: Self-pay | Admitting: Orthopedic Surgery

## 2013-01-08 ENCOUNTER — Ambulatory Visit: Payer: Self-pay | Admitting: Internal Medicine

## 2013-01-09 ENCOUNTER — Encounter: Payer: Self-pay | Admitting: Orthopedic Surgery

## 2013-02-04 ENCOUNTER — Encounter: Payer: Self-pay | Admitting: Orthopedic Surgery

## 2013-03-06 ENCOUNTER — Encounter: Payer: Self-pay | Admitting: Orthopedic Surgery

## 2013-03-12 ENCOUNTER — Encounter: Payer: Self-pay | Admitting: Neurology

## 2013-03-12 DIAGNOSIS — R269 Unspecified abnormalities of gait and mobility: Secondary | ICD-10-CM

## 2013-03-12 DIAGNOSIS — Z5181 Encounter for therapeutic drug level monitoring: Secondary | ICD-10-CM

## 2013-03-12 DIAGNOSIS — D518 Other vitamin B12 deficiency anemias: Secondary | ICD-10-CM

## 2013-03-12 DIAGNOSIS — IMO0001 Reserved for inherently not codable concepts without codable children: Secondary | ICD-10-CM

## 2013-03-12 DIAGNOSIS — R6889 Other general symptoms and signs: Secondary | ICD-10-CM

## 2013-03-12 DIAGNOSIS — M2550 Pain in unspecified joint: Secondary | ICD-10-CM

## 2013-03-12 DIAGNOSIS — G63 Polyneuropathy in diseases classified elsewhere: Secondary | ICD-10-CM

## 2013-03-12 DIAGNOSIS — G992 Myelopathy in diseases classified elsewhere: Secondary | ICD-10-CM

## 2013-03-13 ENCOUNTER — Encounter: Payer: Self-pay | Admitting: Neurology

## 2013-03-13 ENCOUNTER — Ambulatory Visit (INDEPENDENT_AMBULATORY_CARE_PROVIDER_SITE_OTHER): Payer: Medicare Other | Admitting: Neurology

## 2013-03-13 VITALS — BP 105/65 | HR 85

## 2013-03-13 DIAGNOSIS — G992 Myelopathy in diseases classified elsewhere: Secondary | ICD-10-CM

## 2013-03-13 DIAGNOSIS — Z5181 Encounter for therapeutic drug level monitoring: Secondary | ICD-10-CM

## 2013-03-13 DIAGNOSIS — G63 Polyneuropathy in diseases classified elsewhere: Secondary | ICD-10-CM

## 2013-03-13 DIAGNOSIS — R269 Unspecified abnormalities of gait and mobility: Secondary | ICD-10-CM

## 2013-03-13 MED ORDER — TIZANIDINE HCL 2 MG PO TABS: 2.0000 mg | ORAL_TABLET | Freq: Three times a day (TID) | ORAL | Status: AC

## 2013-03-13 MED ORDER — GABAPENTIN 600 MG PO TABS: 600.0000 mg | ORAL_TABLET | Freq: Three times a day (TID) | ORAL | Status: DC

## 2013-03-13 MED ORDER — BACLOFEN 20 MG PO TABS: ORAL_TABLET | ORAL | Status: DC

## 2013-03-13 NOTE — Progress Notes (Signed)
Reason for visit: Myelopathy  Rebecca Castro is an 67 y.o. 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 The patient went into the hospital in November 2013 for a right total hip replacement. Following surgery, the patient has had this significant problems with ambulation. The patient has not yet gotten back to her usual baseline. The patient indicates that she also has significant arthritis of the left knee, and she was told that she will require a total knee replacement at some point. The patient indicates that following surgery, she had transient weakness of the left face, and the left hand. The patient never underwent a CT scan procedure of the brain. The patient required an extended care facility for rehabilitation. The patient is still getting physical therapy. The patient continues to have episodes of falls. The patient reports asterixis, and this may lead to fall. The patient also has pain in the legs associated with the neuropathy, swelling the legs, and spasms in the legs. The patient is on baclofen at a maximal dose, and she is on a near maximal dose for dantrolene. The patient comes to this office for an evaluation.  Past Medical History  Diagnosis Date  . Syncope   . CAD (coronary artery disease)     Cath October 2011 [place], [place]-50-60% mid LAD, FFR 0.85.   Marland Kitchen HTN (hypertension)   . Hyperlipidemia   . DM (diabetes mellitus)   . Diabetic retinopathy(362.0)   . GERD (gastroesophageal reflux disease)   . Transverse myelitis   . Obesity   . Depression   . Gait disorder   . Peripheral neuropathy   . Degenerative arthritis     Past Surgical History  Procedure Laterality Date  . Tumor removal      breast  . Carpal tunnel release      right wrist and left elbow 2011  . Refractive surgery      both eyes  . Cataract extraction      left  eye  . Bilateral tubal ligation    . Bladder repair      Bladder stimulator placement  . Breast lumpectomy    . Total hip arthroplasty Right   . Cubital tunnel surgery, left    . Interstim stimulator placement      Family History  Problem Relation Age of Onset  . Stroke Mother   . Stroke Father     Social history:  reports that she quit smoking about 25 years ago. Her smoking use included Cigarettes. She smoked 0.00 packs per day. She has never used smokeless tobacco. She reports that she does not drink alcohol or use illicit drugs.  Allergies: No Known Allergies  Medications:  Current Outpatient Prescriptions on File Prior to Visit  Medication Sig Dispense Refill  . aspirin 81 MG tablet Take 81 mg by mouth daily.        . carbamazepine (TEGRETOL) 100 MG chewable tablet Chew 100 mg by mouth 2 (two) times daily. As directed      . dantrolene (DANTRIUM) 100 MG capsule Take 100 mg by mouth 3 (three) times daily.       . furosemide (LASIX) 40 MG tablet Take 40 mg by mouth daily.        . insulin glargine (LANTUS) 100 UNIT/ML injection Inject 30 Units into the skin at bedtime.        Marland Kitchen  Insulin Regular Human (NOVOLIN R IJ) Inject 15 Units as directed 3 (three) times daily with meals.       Marland Kitchen lisinopril (PRINIVIL,ZESTRIL) 20 MG tablet Take 20 mg by mouth daily.        . metoCLOPramide (REGLAN) 10 MG tablet Take 10 mg by mouth daily.        . pravastatin (PRAVACHOL) 40 MG tablet Take 40 mg by mouth daily.        Marland Kitchen triamcinolone cream (KENALOG) 0.1 % Apply 1 application topically 2 (two) times daily as needed. As directed      . vitamin E 1000 UNIT capsule Take 1,000 Units by mouth 3 (three) times a week.         No current facility-administered medications on file prior to visit.    ROS:  Out of a complete 14 system review of symptoms, the patient complains only of the following symptoms, and all other reviewed systems are negative.  Fatigue Swelling in the legs Moles Loss of  vision Shortness of breath  Incontinence of stool, and constipation Urination problems, incontinence Feeling hot, cold Joint pain, joint swelling, muscle cramps, achy muscles Allergies, skin sensitivity Confusion, numbness, weakness, tremor Anxiety, insomnia, sleepiness, restless legs  Blood pressure 105/65, pulse 85, height 0' (0 m), weight 0 lb (0 kg).  Physical Exam  General: The patient is alert and cooperative at the time of the examination.  Skin: 2-3+ edema in the lower extremities below the knees is noted.   Neurologic Exam  Cranial nerves: Facial symmetry is present. Speech is normal, no aphasia or dysarthria is noted. Extraocular movements are full. Visual fields are full.  Motor: The patient has good strength in all 4 extremities. Some increased motor tone is noted in the legs.  Coordination: The patient has good finger-nose-finger and heel-to-shin bilaterally.  Gait and station: The patient has a slow, wide-based gait, unsteady. Tandem gait was not attempted. No drift is seen. Asterixis is noted with the arms outstretched.  Reflexes: Deep tendon reflexes are symmetric.   Assessment/Plan:  1. Transverse myelitis  2. Gait disorder  3. Asterixis  4. Paraparesis, spasticity  5. Reports of left-sided weakness postoperatively, rule out stroke  The patient will be set up for a CT scan of the brain. She has an Interstim stimulator for the bladder, and she cannot have a MRI study. The patient is having asterixis that likely is related to the gabapentin dosing. The gabapentin will be reduced to 600 mg 3 times daily. If the asterixis continues, the patient is to contact me. The patient will have tizanidine added for the increased muscle spasms and spasticity in the legs. The patient will followup in 4-6 months. The lower extremity edema may improve at a lower dose of gabapentin.  Marlan Palau MD 03/13/2013 7:27 PM  [facility] [address]  [address] [place], [place] [address]  Phone [phone] Fax [phone]

## 2013-03-14 ENCOUNTER — Other Ambulatory Visit: Payer: Self-pay | Admitting: Neurology

## 2013-03-15 ENCOUNTER — Telehealth: Payer: Self-pay | Admitting: Neurology

## 2013-03-15 LAB — COMPREHENSIVE METABOLIC PANEL
Albumin: 4.3 g/dL (ref 3.6–4.8)
Alkaline Phosphatase: 139 IU/L — ABNORMAL HIGH (ref 39–117)
BUN/Creatinine Ratio: 32 — ABNORMAL HIGH (ref 11–26)
BUN: 20 mg/dL (ref 8–27)
CO2: 27 mmol/L (ref 19–28)
Calcium: 9.8 mg/dL (ref 8.6–10.2)
Chloride: 97 mmol/L (ref 96–108)
Creatinine, Ser: 0.63 mg/dL (ref 0.57–1.00)
Globulin, Total: 2.6 g/dL (ref 1.5–4.5)
Glucose: 226 mg/dL — ABNORMAL HIGH (ref 65–99)
Total Protein: 6.9 g/dL (ref 6.0–8.5)

## 2013-03-15 LAB — CBC WITH DIFFERENTIAL
Basophils Absolute: 0 10*3/uL (ref 0.0–0.2)
Basos: 1 % (ref 0–3)
Eosinophils Absolute: 0.2 10*3/uL (ref 0.0–0.4)
Hemoglobin: 12 g/dL (ref 11.1–15.9)
Lymphs: 27 % (ref 14–46)
MCH: 30.4 pg (ref 26.6–33.0)
MCHC: 33.7 g/dL (ref 31.5–35.7)
Monocytes Absolute: 0.8 10*3/uL (ref 0.1–0.9)
Neutrophils Relative %: 54 % (ref 40–74)
Platelets: 245 10*3/uL (ref 155–379)

## 2013-03-15 NOTE — Telephone Encounter (Signed)
I called the patient. The blood work looks ok, good carbamazepine level. Minimal elevation in the alk phos level.

## 2013-03-20 ENCOUNTER — Ambulatory Visit
Admission: RE | Admit: 2013-03-20 | Discharge: 2013-03-20 | Disposition: A | Discharge status: Home / Self Care | Payer: Medicare Other | Source: Ambulatory Visit | Attending: Neurology | Admitting: Neurology

## 2013-03-20 DIAGNOSIS — R269 Unspecified abnormalities of gait and mobility: Secondary | ICD-10-CM

## 2013-03-21 ENCOUNTER — Other Ambulatory Visit: Payer: Medicare Other

## 2013-03-23 ENCOUNTER — Telehealth: Payer: Self-pay | Admitting: Neurology

## 2013-03-23 NOTE — Telephone Encounter (Signed)
I Called the patient. The CT scan of the head was unremarkable. The symptoms she is having likely is related to medication.

## 2013-04-06 ENCOUNTER — Encounter: Payer: Self-pay | Admitting: Orthopedic Surgery

## 2013-05-06 ENCOUNTER — Encounter: Payer: Self-pay | Admitting: Orthopedic Surgery

## 2013-06-12 ENCOUNTER — Encounter: Payer: Self-pay | Admitting: Cardiovascular Disease

## 2013-07-09 ENCOUNTER — Other Ambulatory Visit: Payer: Self-pay | Admitting: Neurology

## 2013-08-21 ENCOUNTER — Ambulatory Visit (INDEPENDENT_AMBULATORY_CARE_PROVIDER_SITE_OTHER): Payer: Medicare Other | Admitting: Cardiovascular Disease

## 2013-08-21 ENCOUNTER — Encounter: Payer: Self-pay | Admitting: Cardiovascular Disease

## 2013-08-21 VITALS — BP 120/64 | HR 64 | Ht 62.0 in | Wt 183.0 lb

## 2013-08-21 DIAGNOSIS — R079 Chest pain, unspecified: Secondary | ICD-10-CM

## 2013-08-21 DIAGNOSIS — I2510 Atherosclerotic heart disease of native coronary artery without angina pectoris: Secondary | ICD-10-CM

## 2013-08-21 DIAGNOSIS — I10 Essential (primary) hypertension: Secondary | ICD-10-CM

## 2013-08-21 MED ORDER — NITROGLYCERIN 0.4 MG SL SUBL: 0.4000 mg | SUBLINGUAL_TABLET | SUBLINGUAL | Status: AC | PRN

## 2013-08-21 NOTE — Progress Notes (Signed)
History of Present Illness: 67 yo AAF with h/o CAD, HTN, HL, DM, transvere myelitis here today for follow up. I saw her as a new pt in November 2011 for evaluation of syncope. She was visiting her daughter in [place] on August 16, 2010 and was walking down hall when she fell and lost consciousness. She was admitted and ruled out for an MI. A nuclear stress test showed possible inferior wall ischemia. Echo showed normal LV function. Cardiac cath with 50-60% mid LAD lesion with FFR of 0.85. She was released without any intervention. I arranged a Lexiscan stress myoview on 07/25/12 to exclude ischemia. Her LVEF was normal and there was no evidence of ischemia. She had her hip replaced November 2013. She needs her left knee replaced.    She is here today for follow up.   No dizziness, near syncope or syncope. No palpitations. She has been weak since her hip surgery last year. Also notes new squeezing pains in chest, occur at rest. She is essentially wheelchair bound so not active. Chest pains last for 5-10 seconds, associated with dyspnea. No N/V/diaphoresis/dizziness with CP. Not using NTG.    Primary Care Physician: Dr. Yates Decamp in the [facility] in [place].  Orthopedic Surgeon: Dr. Rosita Kea  [facility])  Last Lipid Profile: Followed in primary care.    Past Medical History  Diagnosis Date  . Syncope   . CAD (coronary artery disease)     Cath October 2011 [place], [place]-50-60% mid LAD, FFR 0.85.   Marland Kitchen HTN (hypertension)   . Hyperlipidemia   . DM (diabetes mellitus)   . Diabetic retinopathy   . GERD (gastroesophageal reflux disease)   . Transverse myelitis   . Obesity   . Depression   . Gait disorder   . Peripheral neuropathy   . Degenerative arthritis     Past Surgical History  Procedure Laterality Date  . Tumor removal      breast  . Carpal tunnel release      right wrist and left elbow 2011  . Refractive surgery      both eyes  . Cataract extraction      left eye  .  Bilateral tubal ligation    . Bladder repair      Bladder stimulator placement  . Breast lumpectomy    . Total hip arthroplasty Right   . Cubital tunnel surgery, left    . Interstim stimulator placement      Current Outpatient Prescriptions  Medication Sig Dispense Refill  . baclofen (LIORESAL) 20 MG tablet One tablet three times a day and two tablets at night  30 each    . carbamazepine (TEGRETOL) 100 MG chewable tablet Chew 100 mg by mouth 2 (two) times daily. As directed      . dantrolene (DANTRIUM) 100 MG capsule Take 100 mg by mouth 3 (three) times daily.       . furosemide (LASIX) 40 MG tablet Take 40 mg by mouth daily.        Marland Kitchen gabapentin (NEURONTIN) 600 MG tablet TAKE ONE TABLET BY MOUTH THREE TIMES DAILY  90 tablet  1  . HYDROcodone-acetaminophen (NORCO/VICODIN) 5-325 MG per tablet 1 tablet every 6 (six) hours as needed.       . insulin glargine (LANTUS) 100 UNIT/ML injection Inject 30 Units into the skin at bedtime.        . Insulin Regular Human (NOVOLIN R IJ) Inject 15 Units as directed 3 (three) times daily with  meals.       . lisinopril (PRINIVIL,ZESTRIL) 20 MG tablet Take 20 mg by mouth daily.        . metoCLOPramide (REGLAN) 10 MG tablet Take 10 mg by mouth daily.        Marland Kitchen neomycin-polymyxin b-dexamethasone (MAXITROL) 3.5-10000-0.1 OINT Apply 1 application topically 2 (two) times daily as needed.       Marland Kitchen NEXIUM 40 MG capsule Take 40 mg by mouth daily.       . pravastatin (PRAVACHOL) 40 MG tablet Take 40 mg by mouth daily.        . ranitidine (ZANTAC) 150 MG tablet Take 150 mg by mouth at bedtime.       . sertraline (ZOLOFT) 100 MG tablet Take 100 mg by mouth daily.      Marland Kitchen triamcinolone cream (KENALOG) 0.1 % Apply 1 application topically 2 (two) times daily as needed. As directed      . vitamin E 1000 UNIT capsule Take 1,000 Units by mouth 3 (three) times a week.        Marland Kitchen aspirin 81 MG tablet Take 81 mg by mouth daily.        Marland Kitchen tiZANidine (ZANAFLEX) 2 MG tablet Take 1  tablet (2 mg total) by mouth 3 (three) times daily.  90 tablet  3   No current facility-administered medications for this visit.    No Known Allergies  History   Social History  . Marital Status: Married    Spouse Name: N/A    Number of Children: 2  . Years of Education: HS   Occupational History  . Not on file.   Social History Main Topics  . Smoking status: Former Smoker    Types: Cigarettes    Quit date: 11/07/1987  . Smokeless tobacco: Never Used  . Alcohol Use: No  . Drug Use: No  . Sexual Activity: Not on file   Other Topics Concern  . Not on file   Social History Narrative  . No narrative on file    Family History  Problem Relation Age of Onset  . Stroke Mother   . Stroke Father     Review of Systems:  As stated in the HPI and otherwise negative.   BP 120/64  Pulse 64  Ht 5\' 2"  (1.575 m)  Wt 183 lb (83.008 kg)  BMI 33.46 kg/m2  Physical Examination: General: Well developed, well nourished, NAD HEENT: OP clear, mucus membranes moist SKIN: warm, dry. No rashes. Neuro: No focal deficits Musculoskeletal: Muscle strength 5/5 all ext Psychiatric: Mood and affect normal Neck: No JVD, no carotid bruits, no thyromegaly, no lymphadenopathy. Lungs:Clear bilaterally, no wheezes, rhonci, crackles Cardiovascular: Regular rate and rhythm. No murmurs, gallops or rubs. Abdomen:Soft. Bowel sounds present. Non-tender.  Extremities: No lower extremity edema. Pulses are 2 + in the bilateral DP/PT.  EKG: NSR, rate 64 bpm. Possible old septal infarct.   Lexiscan stress myoview 07/25/12:  Stress Procedure: The patient received IV Lexiscan 0.4 mg over 15-seconds. Technetium 56m Sestamibi injected at 30-seconds. There were no significant changes with Lexiscan. Quantitative spect images were obtained after a 45 minute delay.  Stress ECG: No significant change from baseline ECG  QPS  Raw Data Images: Patient motion noted; appropriate software correction applied.  Stress  Images: Normal homogeneous uptake in all areas of the myocardium.  Rest Images: Normal homogeneous uptake in all areas of the myocardium.  Subtraction (SDS): No evidence of ischemia.  Transient Ischemic Dilatation (Normal <1.22): 1.02  Lung/Heart Ratio (Normal <0.45): 0.26  Quantitative Gated Spect Images  QGS EDV: 71 ml  QGS ESV: 17 ml  Impression  Exercise Capacity: Lexiscan with no exercise.  BP Response: Normal blood pressure response.  Clinical Symptoms: short of breath  ECG Impression: No significant ST segment change suggestive of ischemia.  Comparison with Prior Nuclear Study: No images to compare  Overall Impression: Normal stress nuclear study.  LV Ejection Fraction: 76%. LV Wall Motion: Normal Wall Motion   Assessment and Plan:   1. CAD: Recent chest pain. Lasts for 10 seconds. Occurs at rest but she is not active given her immobility with transverse myelitis. She is known to have 50-60% mid LAD stenosis by cath in 2011 with negative FFR (0.85 in [place]). Her stress myoview in September 2013 did not show ischemia. The recent chest pains are squeezing in quality, occurring every day. Will arrange Lexiscan stress myoview to exclude ischemia. I would have a low threshold for repeating her cardiac cath but she wishes to start with a stress test first.   2. Chest pain: See above.   3. HTN: Well controlled. No changes

## 2013-08-21 NOTE — Patient Instructions (Signed)
Your physician wants you to follow-up in:  3-4 months. You will receive a reminder letter in the mail two months in advance. If you don't receive a letter, please call our office to schedule the follow-up appointment.  Your physician has requested that you have a lexiscan myoview. For further information please visit [email or web address] Please follow instruction sheet, as given.

## 2013-08-27 ENCOUNTER — Ambulatory Visit: Payer: Medicare Other | Admitting: Cardiovascular Disease

## 2013-09-11 ENCOUNTER — Ambulatory Visit (HOSPITAL_COMMUNITY): Payer: Medicare Other | Attending: Internal Medicine | Admitting: Radiology

## 2013-09-11 VITALS — BP 128/66 | Ht 62.0 in | Wt 183.0 lb

## 2013-09-11 DIAGNOSIS — I10 Essential (primary) hypertension: Secondary | ICD-10-CM | POA: Insufficient documentation

## 2013-09-11 DIAGNOSIS — R079 Chest pain, unspecified: Secondary | ICD-10-CM

## 2013-09-11 DIAGNOSIS — Z794 Long term (current) use of insulin: Secondary | ICD-10-CM | POA: Insufficient documentation

## 2013-09-11 DIAGNOSIS — E119 Type 2 diabetes mellitus without complications: Secondary | ICD-10-CM | POA: Insufficient documentation

## 2013-09-11 DIAGNOSIS — I2510 Atherosclerotic heart disease of native coronary artery without angina pectoris: Secondary | ICD-10-CM

## 2013-09-11 DIAGNOSIS — E785 Hyperlipidemia, unspecified: Secondary | ICD-10-CM | POA: Insufficient documentation

## 2013-09-11 DIAGNOSIS — R0602 Shortness of breath: Secondary | ICD-10-CM

## 2013-09-11 DIAGNOSIS — Z87891 Personal history of nicotine dependence: Secondary | ICD-10-CM | POA: Insufficient documentation

## 2013-09-11 DIAGNOSIS — R0789 Other chest pain: Secondary | ICD-10-CM

## 2013-09-11 DIAGNOSIS — Z8249 Family history of ischemic heart disease and other diseases of the circulatory system: Secondary | ICD-10-CM | POA: Insufficient documentation

## 2013-09-11 MED ORDER — REGADENOSON 0.4 MG/5ML IV SOLN
0.4000 mg | Freq: Once | INTRAVENOUS | Status: AC
  Administered 2013-09-11: 0.4 mg via INTRAVENOUS

## 2013-09-11 MED ORDER — AMINOPHYLLINE 25 MG/ML IV SOLN
75.0000 mg | Freq: Once | INTRAVENOUS | Status: AC
  Administered 2013-09-11: 75 mg via INTRAVENOUS

## 2013-09-11 MED ORDER — TECHNETIUM TC 99M SESTAMIBI GENERIC - CARDIOLITE
11.0000 | Freq: Once | INTRAVENOUS | Status: AC | PRN
  Administered 2013-09-11: 11 via INTRAVENOUS

## 2013-09-11 MED ORDER — TECHNETIUM TC 99M SESTAMIBI GENERIC - CARDIOLITE: 33.0000 | Freq: Once | INTRAVENOUS | Status: DC | PRN

## 2013-09-11 NOTE — Progress Notes (Signed)
[facility] SITE 3 NUCLEAR MED [address] [place], [place] [address] [phone]    Cardiology Nuclear Med Study  Rebecca Castro is a 67 y.o. female     MRN : [id number]     DOB: 1946-06-30  Procedure Date: 09/11/2013  Nuclear Med Background Indication for Stress Test:  Evaluation for Ischemia History:  ECHO: MPI: 07/25/12 EF: 76% Cardiac Risk Factors: Family History - CAD, History of Smoking, Hypertension, IDDM,  and Lipids  Symptoms:  Chest Pain and SOB   Nuclear Pre-Procedure Caffeine/Decaff Intake:  None > 12 hrs NPO After: 8:30pm   Lungs:  clear O2 Sat: 96% on room air. IV 0.9% NS with Angio Cath:  22g  IV Site: L Antecubital x 1, tolerated well IV Started by:  Irean Hong, RN  Chest Size (in):  40 Cup Size: DD  Height: 5\' 2"  (1.575 m)  Weight:  183 lb (83.008 kg)  BMI:  Body mass index is 33.46 kg/(m^2). Tech Comments:  Last tegretol at 11:00pm last night. Full dose of insulin last night, no insulin today. Fasting CBG was 82 at 0600 today. Orange juice and saltine crackers given. 15 minute pc CBG was 140 at 0835.Patient falling asleep on arrival while asking pre -procedure prep questions. Patient is easy to arouse. Husband states that the patient is always sleepy due to medications for her spine and that her doctor is aware her sleepiness. Patient took her Norco this am.Husband to stay with patient during the waiting period. Irean Hong, RN.  Aminophylline 75 mg IV given for symptoms. All symptoms were resolved.    Nuclear Med Study 1 or 2 day study: 1 day  Stress Test Type:  Eugenie Birks  Reading MD: Kristeen Miss, MD  Order Authorizing Provider:  Verne Carrow, MD  Resting Radionuclide: Technetium 47m Sestamibi  Resting Radionuclide Dose: 11.0 mCi   Stress Radionuclide:  Technetium 85m Sestamibi  Stress Radionuclide Dose: 33.0 mCi           Stress Protocol Rest HR: 75 Stress HR: 90  Rest BP: 128/66 Stress BP: 150/45  Exercise Time (min): n/a METS: n/a     Predicted Max HR: 154 bpm % Max HR: 58.44 bpm Rate Pressure Product: [id number]   Dose of Adenosine (mg):  n/a Dose of Lexiscan: 0.4 mg  Dose of Atropine (mg): n/a Dose of Dobutamine: n/a mcg/kg/min (at max HR)  Stress Test Technologist: Milana Na, EMT-P  Nuclear Technologist:  Dario Guardian, CNMT     Rest Procedure:  Myocardial perfusion imaging was performed at rest 45 minutes following the intravenous administration of Technetium 4m Sestamibi. Rest ECG: Normal sinus rhythm. Decreased R wave V1 to V3  Stress Procedure:  The patient received IV Lexiscan 0.4 mg over 15-seconds.  Technetium 51m Sestamibi injected at 30-seconds. The patient had sob, chest tightness,headache, and bilateral arm pain with the Lexiscan injection. Quantitative spect images were obtained after a 45 minute delay. Stress ECG: No significant change from baseline ECG  QPS Raw Data Images:  Normal; no motion artifact; normal heart/lung ratio. Stress Images:  Normal homogeneous uptake in all areas of the myocardium. Rest Images:  Normal homogeneous uptake in all areas of the myocardium. Subtraction (SDS):  No evidence of ischemia. Transient Ischemic Dilatation (Normal <1.22):  0.95 Lung/Heart Ratio (Normal <0.45):  0.27  Quantitative Gated Spect Images QGS EDV:  71 ml QGS ESV:  24 ml  Impression Exercise Capacity:  Lexiscan with no exercise. BP Response:  Normal blood pressure response. Clinical  Symptoms:  Shortness of breath with some chest tightness. ECG Impression:  No significant ST segment change suggestive of ischemia. Comparison with Prior Nuclear Study: No images to compare  Overall Impression:  Normal stress nuclear study. this is a low risk scan.  LV Ejection Fraction: 66%.  LV Wall Motion:  Normal Wall Motion.  Willa Rough

## 2013-09-12 ENCOUNTER — Other Ambulatory Visit: Payer: Self-pay | Admitting: Neurology

## 2013-09-16 ENCOUNTER — Ambulatory Visit (INDEPENDENT_AMBULATORY_CARE_PROVIDER_SITE_OTHER): Payer: Medicare Other | Admitting: Neurology

## 2013-09-16 ENCOUNTER — Encounter: Payer: Self-pay | Admitting: Neurology

## 2013-09-16 VITALS — BP 102/60 | HR 64 | Wt 186.0 lb

## 2013-09-16 DIAGNOSIS — R269 Unspecified abnormalities of gait and mobility: Secondary | ICD-10-CM

## 2013-09-16 DIAGNOSIS — G992 Myelopathy in diseases classified elsewhere: Secondary | ICD-10-CM

## 2013-09-16 MED ORDER — GABAPENTIN 400 MG PO CAPS: 400.0000 mg | ORAL_CAPSULE | Freq: Three times a day (TID) | ORAL | Status: AC

## 2013-09-16 NOTE — Patient Instructions (Signed)

## 2013-09-16 NOTE — Progress Notes (Signed)
Reason for visit: Transverse myelitis  Rebecca Castro is an 67 y.o. female  History of present illness:  Rebecca Castro is a 67 year old left-handed black female with a history of a transverse myelitis. The patient has fallen on occasion, with the last fall 2 weeks ago. The patient is able to ambulate only very short distances inside the house with a walker. The patient has had some episodes of dizziness, and she continues to have a mild problems with asterixis, even after reduction in the gabapentin dose. The patient has had a carbamazepine level checked on her last visit which were unremarkable. The patient does have some discomfort in the legs, left greater than right. The patient has weakness as well. The patient continues to have some urinary incontinence, even with the InterStim bladder stimulator. The patient returns for an evaluation.  Past Medical History  Diagnosis Date  . Syncope   . CAD (coronary artery disease)     Cath October 2011 [place], [place]-50-60% mid LAD, FFR 0.85.   Marland Kitchen HTN (hypertension)   . Hyperlipidemia   . DM (diabetes mellitus)   . Diabetic retinopathy   . GERD (gastroesophageal reflux disease)   . Transverse myelitis   . Obesity   . Depression   . Gait disorder   . Peripheral neuropathy   . Degenerative arthritis     Past Surgical History  Procedure Laterality Date  . Tumor removal      breast  . Carpal tunnel release      right wrist and left elbow 2011  . Refractive surgery      both eyes  . Cataract extraction      left eye  . Bilateral tubal ligation    . Bladder repair      Bladder stimulator placement  . Breast lumpectomy    . Total hip arthroplasty Right   . Cubital tunnel surgery, left    . Interstim stimulator placement      Family History  Problem Relation Age of Onset  . Stroke Mother   . Stroke Father     Social history:  reports that she quit smoking about 25 years ago. Her smoking use included Cigarettes. She smoked 0.00 packs per  day. She has never used smokeless tobacco. She reports that she does not drink alcohol or use illicit drugs.   No Known Allergies  Medications:  Current Outpatient Prescriptions on File Prior to Visit  Medication Sig Dispense Refill  . aspirin 81 MG tablet Take 81 mg by mouth daily.        . baclofen (LIORESAL) 20 MG tablet One tablet three times a day and two tablets at night  30 each    . carbamazepine (TEGRETOL) 100 MG chewable tablet Chew 100 mg by mouth 2 (two) times daily. As directed      . dantrolene (DANTRIUM) 100 MG capsule Take 100 mg by mouth 3 (three) times daily.       . furosemide (LASIX) 40 MG tablet Take 40 mg by mouth daily.        Marland Kitchen HYDROcodone-acetaminophen (NORCO/VICODIN) 5-325 MG per tablet 1 tablet every 6 (six) hours as needed.       . insulin glargine (LANTUS) 100 UNIT/ML injection Inject 30 Units into the skin at bedtime.        . Insulin Regular Human (NOVOLIN R IJ) Inject 15 Units as directed 3 (three) times daily with meals.       Marland Kitchen lisinopril (PRINIVIL,ZESTRIL) 20 MG tablet Take  20 mg by mouth daily.        . metoCLOPramide (REGLAN) 10 MG tablet Take 10 mg by mouth daily.        Marland Kitchen neomycin-polymyxin b-dexamethasone (MAXITROL) 3.5-10000-0.1 OINT Apply 1 application topically 2 (two) times daily as needed.       Marland Kitchen NEXIUM 40 MG capsule Take 40 mg by mouth daily.       . nitroGLYCERIN (NITROSTAT) 0.4 MG SL tablet Place 1 tablet (0.4 mg total) under the tongue every 5 (five) minutes as needed for chest pain.  25 tablet  6  . pravastatin (PRAVACHOL) 40 MG tablet Take 40 mg by mouth daily.        . ranitidine (ZANTAC) 150 MG tablet Take 150 mg by mouth at bedtime.       . sertraline (ZOLOFT) 100 MG tablet Take 100 mg by mouth daily.      Marland Kitchen tiZANidine (ZANAFLEX) 2 MG tablet Take 1 tablet (2 mg total) by mouth 3 (three) times daily.  90 tablet  3  . triamcinolone cream (KENALOG) 0.1 % Apply 1 application topically 2 (two) times daily as needed. As directed      . vitamin  E 1000 UNIT capsule Take 1,000 Units by mouth 3 (three) times a week.         No current facility-administered medications on file prior to visit.    ROS:  Out of a complete 14 system review of symptoms, the patient complains only of the following symptoms, and all other reviewed systems are negative.  Weight gain Swelling in the legs Ringing in the ears Moles Blurred vision Snoring Incontinence of bladder, urination problems Memory loss, confusion, headache, weakness, dizziness Feeling hot, cold Joint pain, muscle cramps, achy muscles Anxiety, insomnia  Blood pressure 102/60, pulse 64, weight 186 lb (84.369 kg).  Physical Exam  General: The patient is alert and cooperative at the time of the examination.  Skin: No significant peripheral edema is noted.   Neurologic Exam  Mental status: The Mini-Mental status examination done today shows a total score 28/30.   Cranial nerves: Facial symmetry is present. Speech is normal, no aphasia or dysarthria is noted. Extraocular movements are full. Visual fields are full.  Motor: The patient has good strength inthe upper extremities. With the lower extremities, there is proximal weakness in the left leg, 3/5 strength. The patient has 4/5 strength in the right leg, and in the distal left leg. Increased motor tone is noted in the legs.   Sensory examination: Soft touch sensation is symmetric on the face, decreased on the left arm and left leg.   Coordination: The patient has good finger-nose-finger and heel-to-shin bilaterally. With the arms outstretched, mild asterixis is noted.  Gait and station: The patient has difficulty arising from a seated position. The patient is wheelchair-bound. Once up, the patient is able to take a couple steps with assistance, and gait cannot be performed.  Romberg is negative. No drift is seen.  Reflexes: Deep tendon reflexes are symmetric.   Assessment/Plan:   1. Transverse myelitis  2. Gait  disturbance  The patient seems to have mild asterixis, and the gabapentin dose will once again be reduced taking 400 mg 3 times daily. The patient was given a prescription for a cushion for the wheelchair. The patient will followup in 6 months.  Marlan Palau MD 09/16/2013 6:16 PM  [facility] [address] [place], [place] [address]  Phone [phone] Fax [phone]

## 2013-10-24 ENCOUNTER — Other Ambulatory Visit: Payer: Self-pay | Admitting: Neurology

## 2014-03-16 ENCOUNTER — Ambulatory Visit: Payer: Medicare Other | Admitting: Neurology

## 2014-04-25 IMAGING — US US EXTREM LOW VENOUS BILAT
1 series · 14 of 24 positions shown · non-contrast
Comparison: none

REASON FOR EXAM: evaluate for DVT in pt with fever, post - op and now
calf pain
COMMENTS:

[Series 1: us extrem low venous bilat · 0.09mm/px · 14 of 46 slices shown]
[im 1/46]
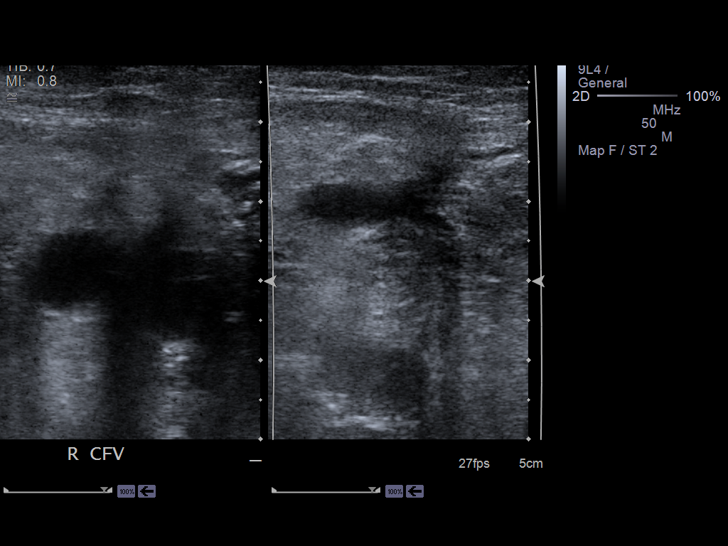
[im 4/46]
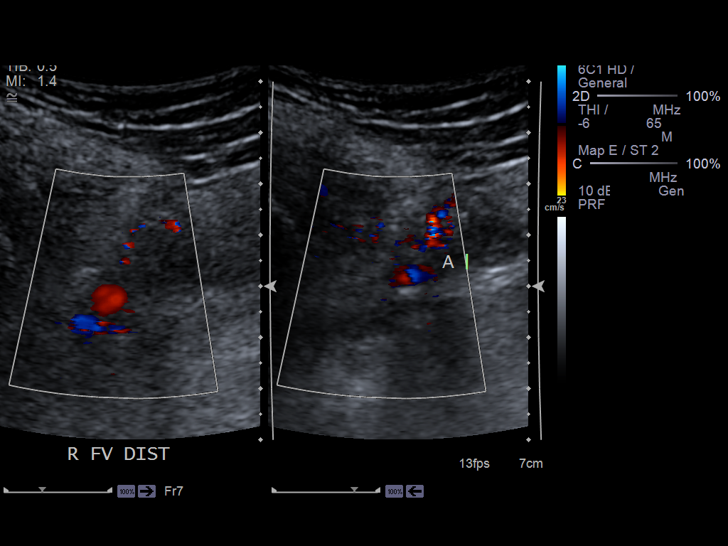
[im 8/46]
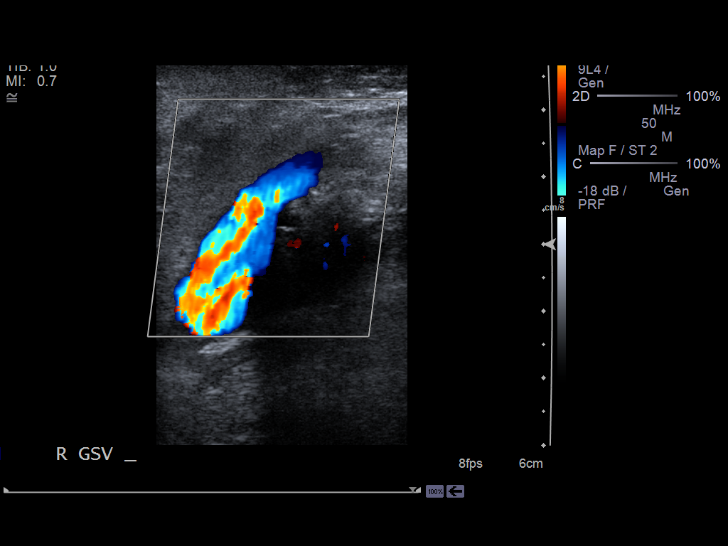
[im 12/46]
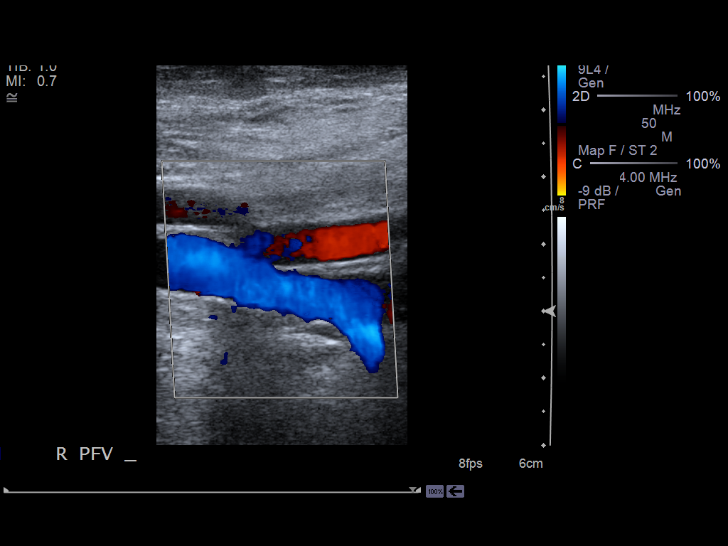
[im 14/46]
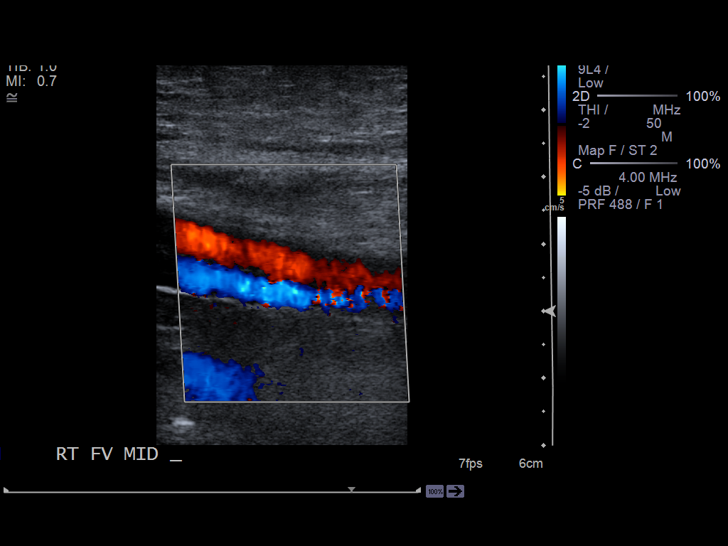
[im 18/46]
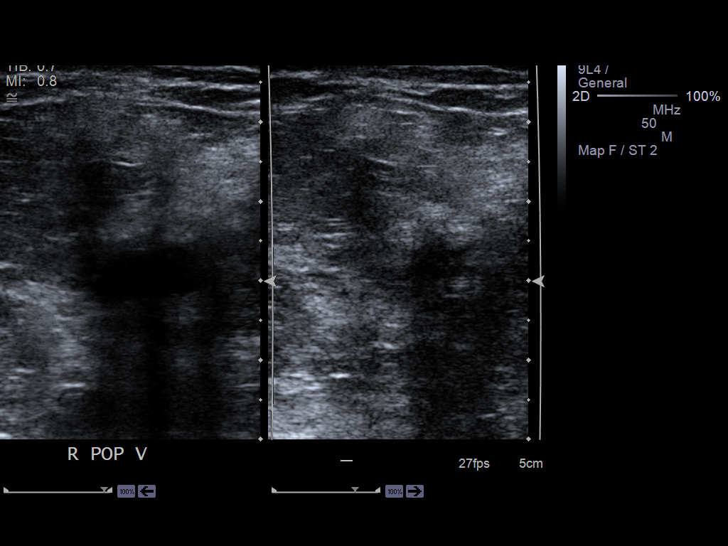
[im 22/46]
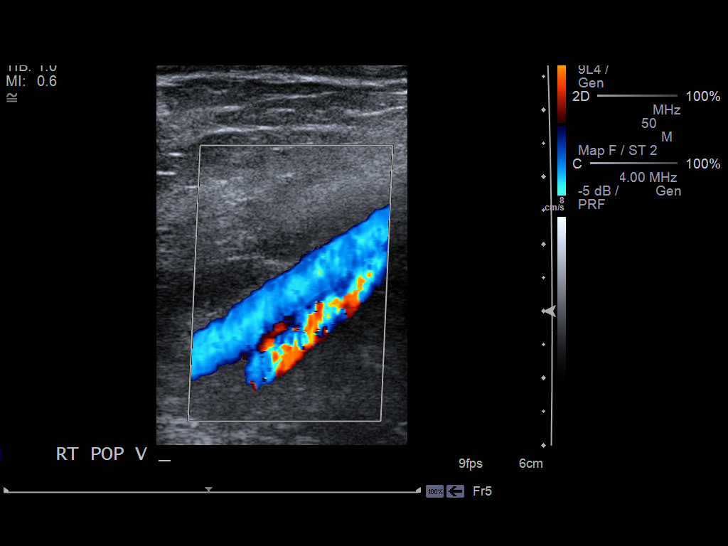
[im 24/46]
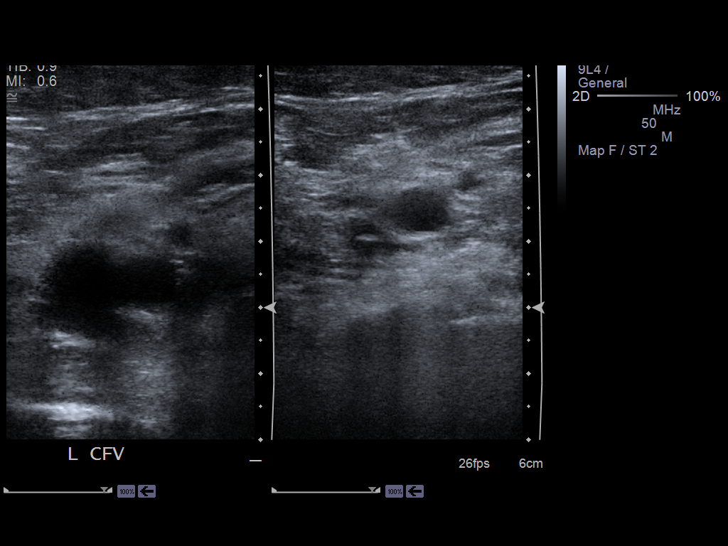
[im 28/46]
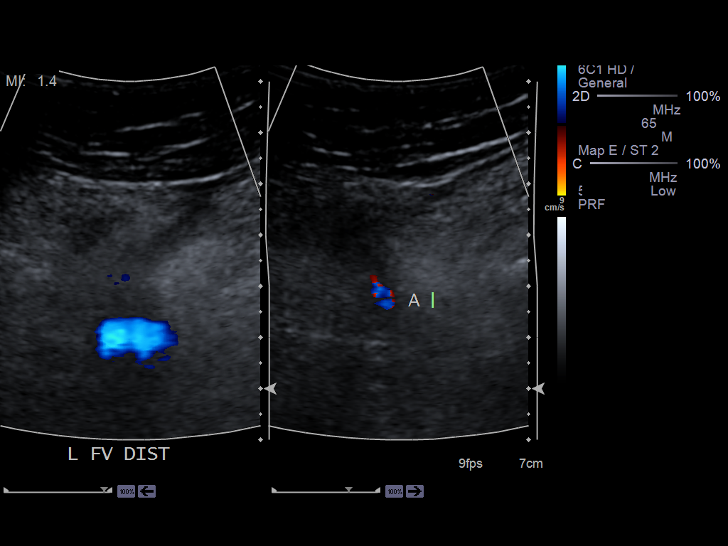
[im 32/46]
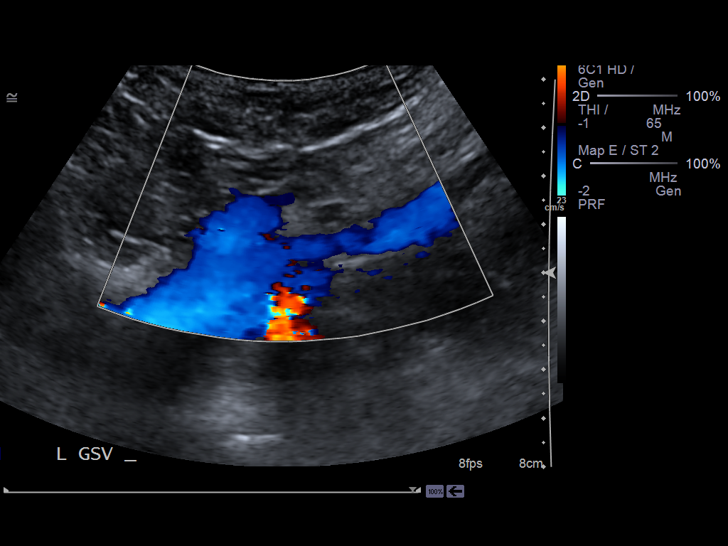
[im 36/46]
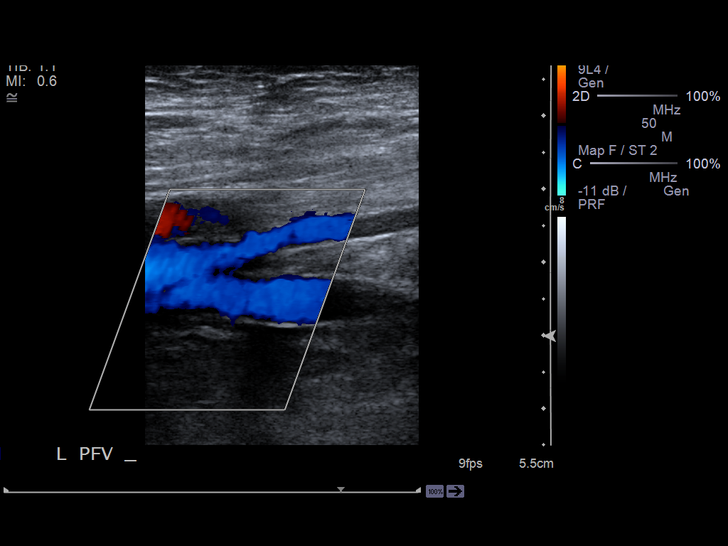
[im 38/46]
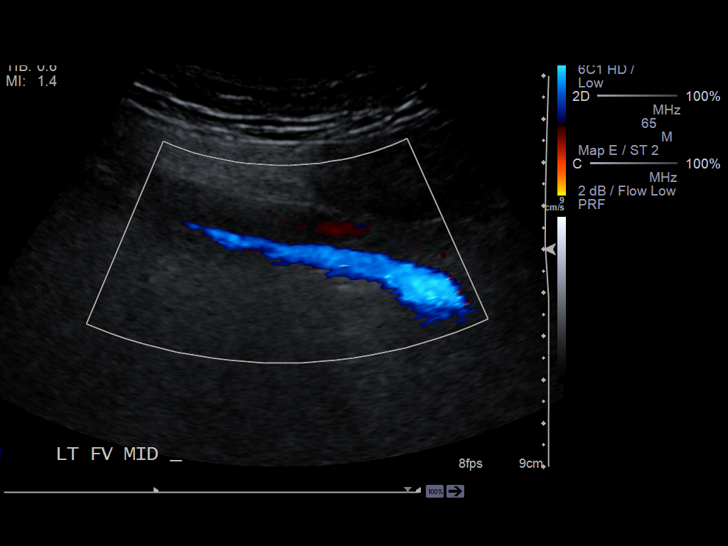
[im 42/46]
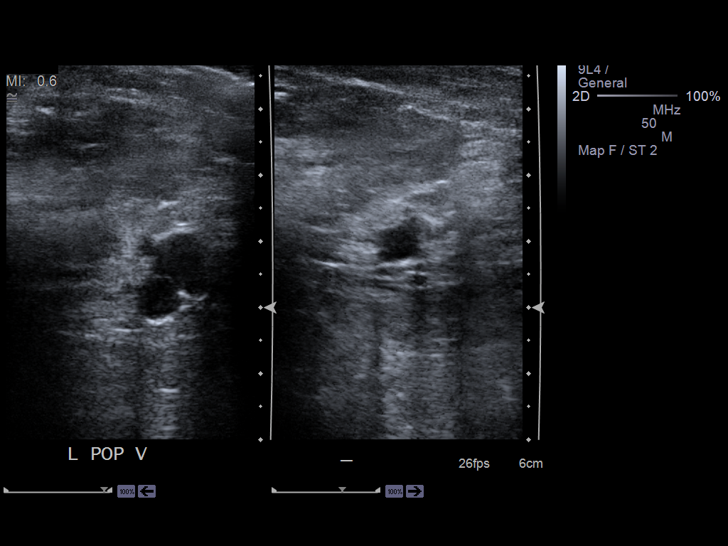
[im 46/46]
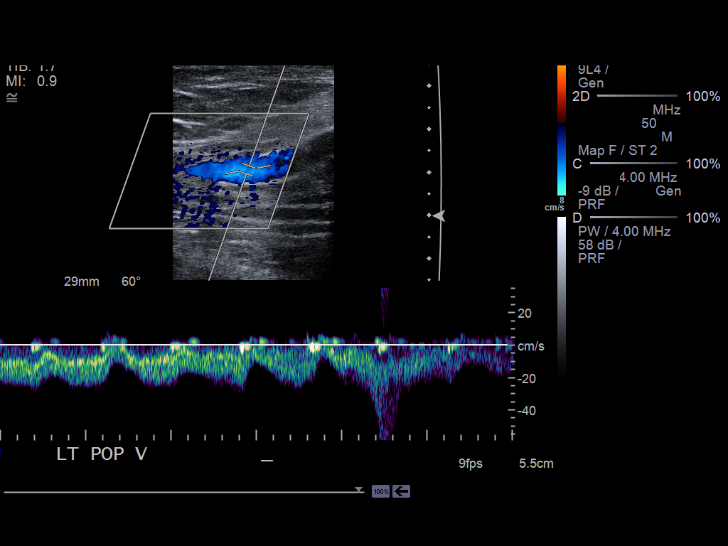

[14 of 24 positions shown; findings below may reference images not displayed]

PROCEDURE:     US  - US DOPPLER LOW EXTR BILATERAL  - September 17, 2012  [DATE]

RESULT:     The deep venous systems of the right and left lower extremities
were evaluated with grayscale and color flow Doppler ultrasound. The patient
is noted have significant lower extremity edema.

The right and left femoral and popliteal veins are normally compressible.
The waveform patterns are normal and the color flow images are normal. The
response to the augmentation and Valsalva maneuvers is normal.
IMPRESSION: There is no evidence of thrombus within the right or left
femoral or popliteal veins.

[REDACTED]

## 2014-04-25 IMAGING — CT CT HEAD WITHOUT AND WITH CONTRAST
1 of 2 series · 13 of 30 positions shown, 17 images · non-contrast
Comparison: none

REASON FOR EXAM: new onset left sided weakness
COMMENTS:

PROCEDURE:     CT  - CT HEAD W/WO  - September 17, 2012  [DATE]
RESULT:     History: Weakness.
Comparison Study: No prior.

[Series 2: without · axial · non-contrast · 0.40mm/px · z∈[-162,-42]mm · 13 of 28 slices shown, 17 images]
[im 2/28  brain]
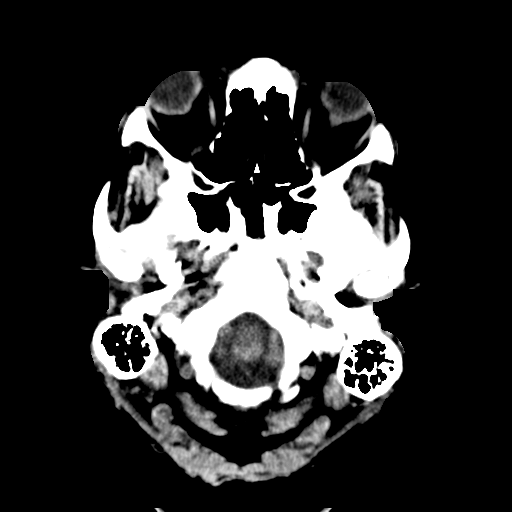
[im 2/28  bone]
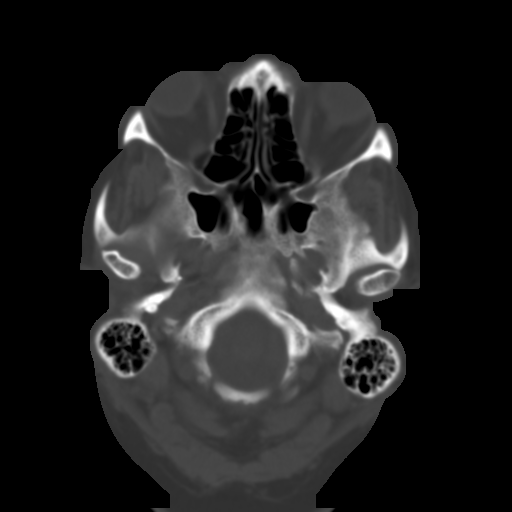
[im 4/28  brain]
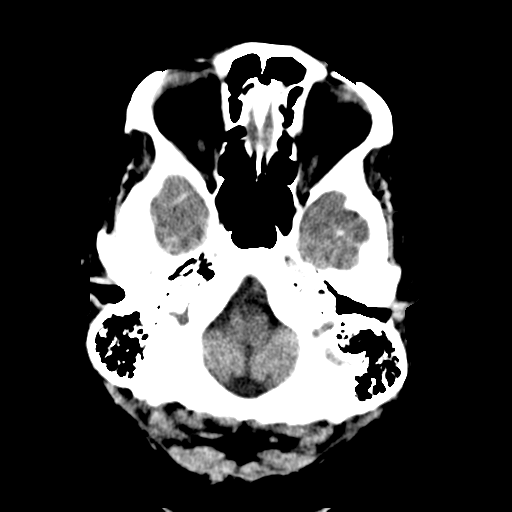
[im 6/28  brain]
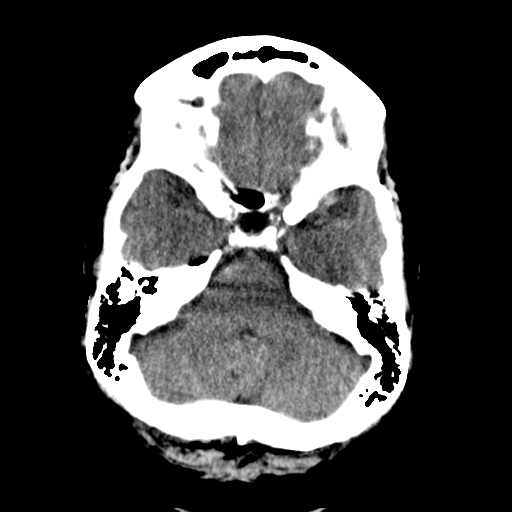
[im 8/28  brain]
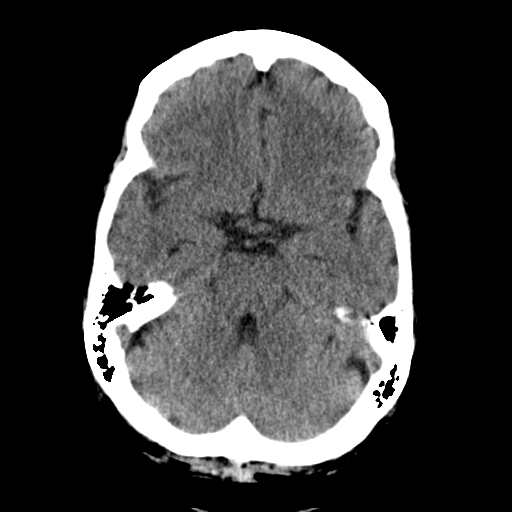
[im 10/28  brain]
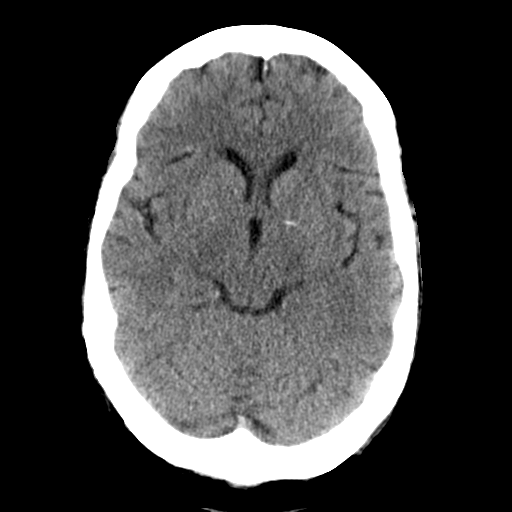
[im 10/28  bone]
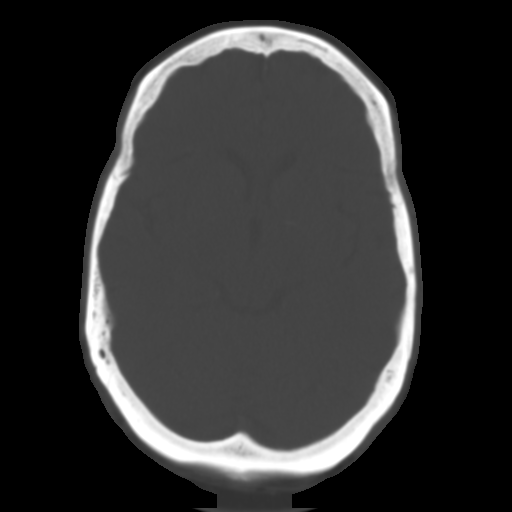
[im 12/28  brain]
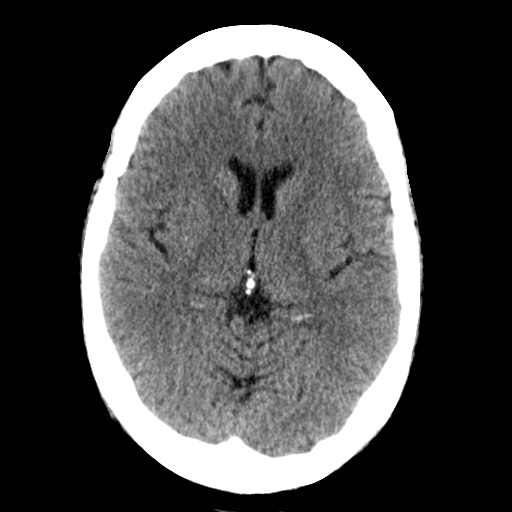
[im 14/28  brain]
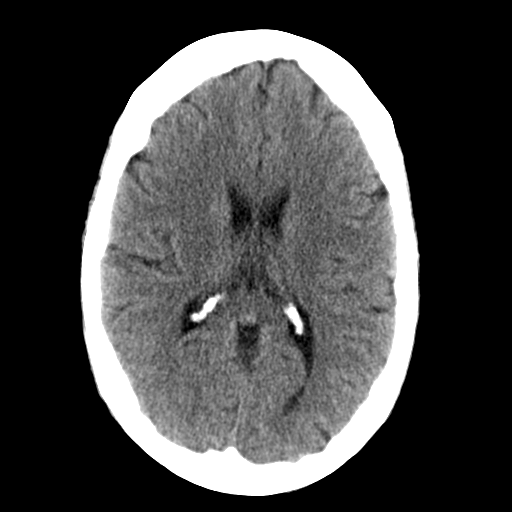
[im 16/28  brain]
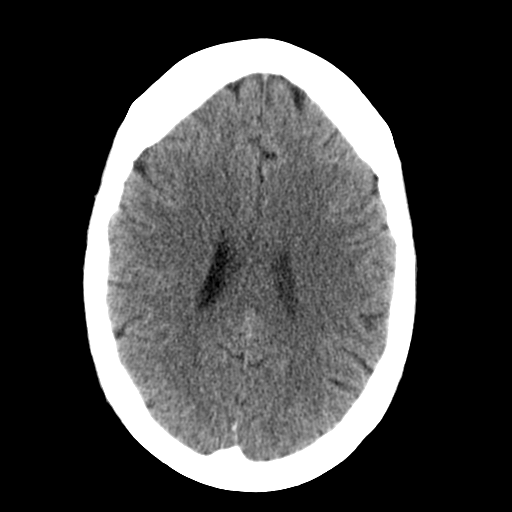
[im 18/28  brain]
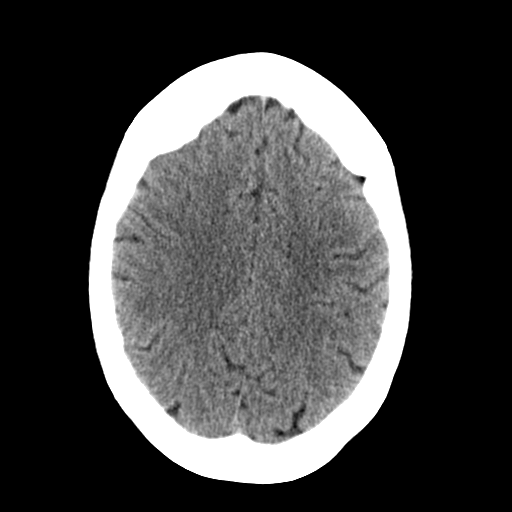
[im 18/28  bone]
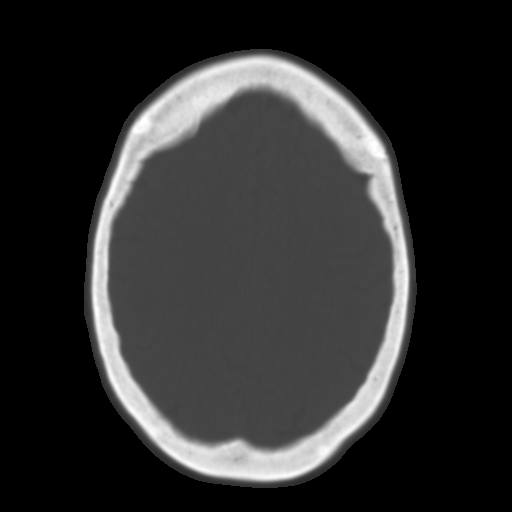
[im 20/28  brain]
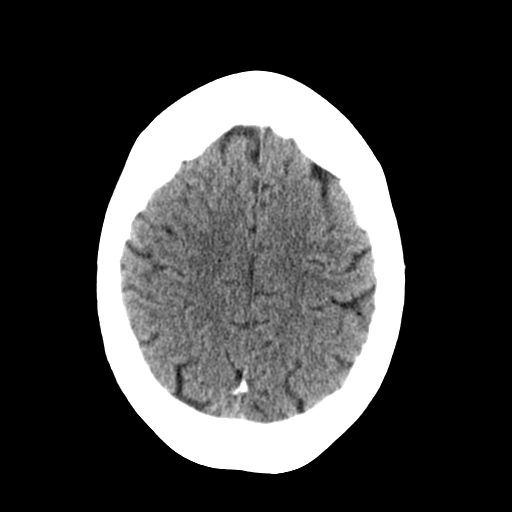
[im 22/28  brain]
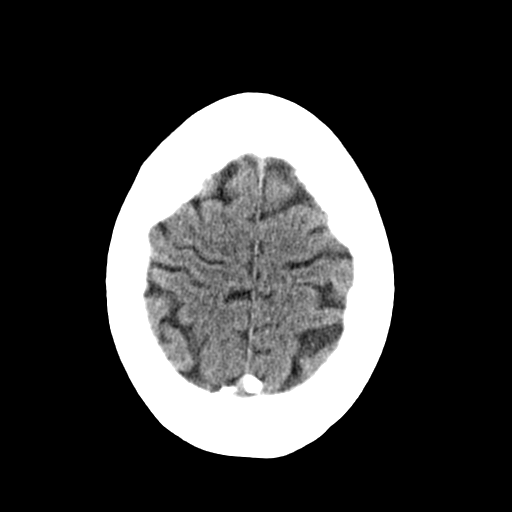
[im 24/28  brain]
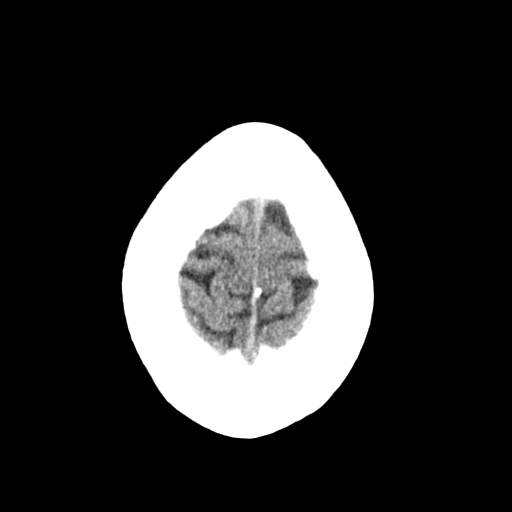
[im 26/28  brain]
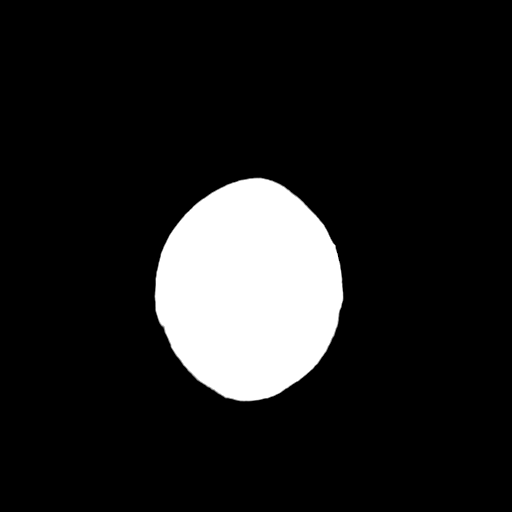
[im 26/28  bone]
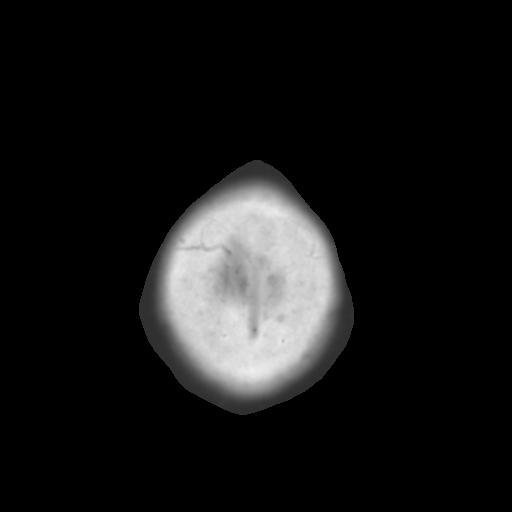

[13 of 30 positions shown; findings below may reference images not displayed]

FINDINGS: Standard nonenhanced enhanced CT obtained with 50 cc of
Bsovue-TNN. No mass. No hydrocephalus. No enhancing lesion. Basal ganglia
calcification. No acute bony abnormality.
IMPRESSION: No acute abnormality.

## 2014-09-23 ENCOUNTER — Encounter: Payer: Self-pay | Admitting: Neurology

## 2014-09-29 ENCOUNTER — Encounter: Payer: Self-pay | Admitting: Neurology

## 2015-02-23 NOTE — Consult Note (Signed)
PATIENT NAME:  Rebecca Castro, Rebecca Castro MR#:  [id number] DATE OF BIRTH:  09/04/1946  DATE OF CONSULTATION:  09/15/2012  REFERRING PHYSICIAN:  Dr. Rosita KeaMenz  CONSULTING PHYSICIAN:  Letta PateJohn B. Danne HarborWalker III, MD  HISTORY OF PRESENT ILLNESS: Ms. Jinny SandersFoxx is a 69 year old black diabetic lady with a history of hypertension and transverse myelitis who underwent a right total hip replacement on 11/07. The patient has been feeling well with the exception of pain at the surgical site. Over the last 48 hours, however, she has been spiking a fever. This afternoon it spiked to 101.5. She states she's had a little cough but it's been generally nonproductive. She has had no dysuria. Chest x-ray is unremarkable. Urinalysis is also unremarkable.   PHYSICAL EXAMINATION:   GENERAL: She is alert in no acute distress. She certainly does not look toxic. She is not flushed or warm to the touch. There are no rashes.   LUNGS: Clear to auscultation and percussion without rales, rhonchi, or wheezes. There are no rubs.   CARDIAC: Regular rhythm without murmur, gallop, or rub.   ABDOMEN: Soft and nontender. Liver and spleen are not enlarged. Bowel sounds are normal.   EXTREMITIES: There is no peripheral edema. There is no calf tenderness. The surgical incision site looks clean. The hip itself, however, is somewhat warm to the touch and tense.   PLAN:  1. The patient will have both blood and urine cultures done.  2. Will also check a sed rate and C-reactive protein.  3. Will start the patient on Zosyn empirically pending culture report.  4. If her sed rate or C-reactive protein are markedly elevated, would consider getting a CT scan of the right hip to rule out hematoma or abscess.  ____________________________ Letta PateJohn B. Danne HarborWalker III, MD jbw:[id number] D: 09/15/2012 15:45:41 ET T: 09/15/2012 16:22:04 ET JOB#: [id number] Elmo PuttJOHN B WALKER III MD ELECTRONICALLY SIGNED 09/17/2012 7:35

## 2015-02-23 NOTE — Discharge Summary (Signed)
PATIENT NAME:  Rebecca Castro, Rebecca Castro MR#:  [id number] DATE OF BIRTH:  12-Apr-1946  DATE OF ADMISSION:  09/12/2012 DATE OF DISCHARGE:  09/23/2012  ADMITTING DIAGNOSIS: Severe right hip osteoarthritis.   DISCHARGE DIAGNOSIS: Severe right hip osteoarthritis.   PROCEDURE: Right total hip replacement.   SURGEON: Leitha SchullerMichael J. Menz, M.D.   ANESTHESIA: General   ESTIMATED BLOOD LOSS: 200 mL.   COMPLICATIONS: None.   SPECIMENS: Femoral head.   IMPLANTS: Medacta AMIStem ? size one standard, a 46-mm versa fit cup DM with a 46-mm liner and a size s femoral head, 22.2 mm in diameter.   HISTORY: Rebecca Castro is a 69 year old African American female who has had right hip pain for quite some time. She at baseline has had viral transverse myelitis since 1989 and she is able to stand to transfer and walk only around her home. Whenever she is out in public she uses a walker. She takes hydrocodone with Tylenol 7.5/500, 4 pills a day, approximately 1 tablet every six hours. She is taking Aleve for her pain. She has not done physical therapy. She has tried one injection which did not help. She reports she has been cleared by her cardiologist for the surgery. She is a diabetic and recent blood sugar this morning was 108. Her last hemoglobin A1c was 7.8%. She is unsure if her primary care doctor and neurologist have cleared her for surgery. She is accompanied by her husband today in the clinic. She has no problems with anesthesia, bleeding, or blood clotting.   PHYSICAL EXAMINATION:   GENERAL: Well developed, well nourished, alert, and oriented x 3. No acute distress. Sensation intact right lower extremity. HEART: Regular rate and rhythm.  RESPIRATORY: Lung sounds are clear throughout. Chest expansion is symmetric. ABDOMEN: No hepatomegaly. No splenomegaly. Nontender to palpation throughout entire abdomen and positive bowel sounds in all four quadrants. CARDIOVASCULAR: Cap refill brisk. No edema of the right lower extremity.  MUSCULOSKELETAL: In a wheelchair today in the clinic. Refusing to weight bear. Pain is reproduced with hip internal and external rotation and flexion. She is mildly tender to palpation over the right greater trochanter and in the right groin. Knee range of motion is equal and full. SKIN: No scars, rashes, or lesions.   HOSPITAL COURSE: The patient was admitted to the hospital on 09/12/2012. She had surgery that same day and was brought to the orthopedic floor from the PAC-U in stable condition. On postoperative day one the patient was monitored with vital signs and lab work, which showed the patient had slight anemia, and high-grade fevers of 103 overnight. The patient had a chest x-ray, urinalysis, and blood cultures which were all negative.  On 09/14/2012, the patient was noted to have some confusion. Narcotics were held and the patient was much more alert with only Tylenol for pain. On 09/15/2012, hemoglobin dropped down to 7.9 and she was given 1 unit of packed red blood cells. She continued to have low-grade fevers overnight and medicine was consulted.  On 09/16/2012 the patient had a CT of the right hip, which was negative for any fluid accumulation or infection. On 09/17/2012 she complained of left-sided weakness. A CT of the head was ordered, which was negative. On 09/18/2012, the patient's left-sided weakness was resolved. Family noted her to move extremities better. She had no weakness noted on exam. On 11/14 the patient underwent I and D of the left hip with irrigation. A PICC line was placed and the patient was started on IV antibiotics. Infectious  disease was also consulted and recommended two weeks of IV antibiotics. On 09/21/2012, the patient was noted to have some bright red bleeding in her panties.  OB/GYN was consulted and came down and recommended followup within two weeks. If recurrence of bleeding happened again in the hospital, then an ultrasound would be needed before discharge. On  09/22/2012 the patient had her wound VAC changed, which was placed on the date of the incision and drainage on 09/18/2012 when that was changed to the new silver foam. The wound at this time measures 6 cm long, 2 cm deep, and 1 cm wide. On 09/23/2012 the patient was stable and ready for discharge to a rehab facility.   CONDITION AT DISCHARGE: Stable.   DISCHARGE INSTRUCTIONS:  1. The patient should wear TED hose and knee-highs bilaterally.  2. She is to elevate the heels off the bed and use incentive spirometry every one hour every hour, with cough and deep breathe every two hours.  3. OT should evaluate and treat patient for assistance with activities of daily living and should assist with patient's ambulation and muscle weakness.  4. Activity is weight-bearing as tolerated, out of bed 2 times a day  5. Patient should consume an 1800-calorie diet with 2 grams of sodium.  6. Physical therapy should evaluate and treat the patient for difficulty walking with right weightbearing as tolerated.  7. She has a wound VAC that should be maintained at a setting of 125 for the right hip. She should have continuous suction with wound VAC.  8. She should have PICC line care orders in. They should notify the PICC team if the line has  migrated out more than 2 cm from the original measurement. Change dressing every 24 hours. The line is to be flushed with heparin 50 units, 5 mL IV push daily. Flush each unused port with 50 units of heparin every 24 hours, 5 units to each unused heparin lumen daily. The patient should also be flushed with 5 mL of  IV push Castro.r.n. for line patency.   DISCHARGE MEDICATIONS:  1. Tylenol 500 to 1000 mg oral every four hours as needed for pain or temperature greater than 100.4.  2. Oxycodone 5 to 10 mg oral q. 4 hours Castro.r.n. pain.  3. Milk of Magnesia 30 mL oral b.i.d. Castro.r.n. constipation.  4. Dulcolax 10 mg rectal daily Castro.r.n. constipation.  5. Aluminum mag hydroxide with simethicone  400/400/40 mg per 5 mL suspension, 30 mL oral q. 6 hours Castro.r.n. indigestion or heartburn. 6. Sertraline tablet 100 mg oral daily.  7. Ranitidine 150 mg oral at bedtime.  8. Pravastatin tablet 40 mg oral at bedtime. 9. Insulin Novolin regular injection 15 units subcutaneous 3 times daily. 10. Esomeprazole capsule 40 mg oral daily.  11. Reglan 10 mg oral q.i.d.  12. Lisinopril 20 mg oral daily.  13. Insulin glargine 30 units subcutaneous at bedtime.  14. Gabapentin 800 mg oral 3 times daily. 15. Dantrolene 100 mg oral t.i.d.  16. Carbamazepine 100 mg oral b.i.d.  17. Benadryl 25 mg oral at bedtime.  18. Baclofen 20 mg oral q. 6 hours Castro.r.n. spasms. 19. Xarelto 10 mg oral every morning for 24 days.  20. Ibuprofen 400 mg oral q. 8 hours Castro.r.n. or temperature greater than 100.4.  21. Lorazepam 0.5 mg oral q. 6 hours Castro.r.n. for agitation.  22. Furosemide 40 mg oral daily Castro.r.n. edema.    23. Ceftriaxone injection 2 grams in dextrose 5% 100 mL IV piggyback  q. 12 hours infused over 30 minutes.    ____________________________ Evon Slack, PA-C tcg:bjt D: 09/22/2012 16:52:22 ET T: 09/23/2012 11:26:31 ET JOB#: [id number]  cc: Evon Slack, PA-C, <Dictator> Evon Slack [place] ELECTRONICALLY SIGNED 10/02/2012 10:07

## 2015-02-23 NOTE — Consult Note (Signed)
PATIENT NAME:  Rebecca Castro, Rebecca Castro MR#:  [id number] DATE OF BIRTH:  1946-08-20  DATE OF CONSULTATION:  09/17/2012  REFERRING PHYSICIAN:  Lisette Grinder, MD  CONSULTING PHYSICIAN:  Cheral Marker. Ola Spurr, MD  PRIMARY CARE PHYSICIAN:  Dr. Lisette Grinder. ORTHOPEDIST: Dr. Hessie Knows.   REASON FOR CONSULTATION:  Postoperative fever of unknown origin.   HISTORY OF PRESENT ILLNESS: This is a pleasant 69 year old woman who underwent a total hip replacement by Dr. Rudene Christians on 11/07 on the right side due to severe osteoarthritis. She tolerated the procedure well and was on the usual recovery course until 11/10 when she developed a fever to 101.5. She had had a little cough at that time as well. She had blood cultures and a basic work-up done and was started on Zosyn empirically. Fevers have persisted despite antibiotics. She has had a CT of her head and CT of her surgical site as well as follow-up chest x-rays without etiology.   Basically today the patient reports feeling relatively well. She does feel chills sometimes when she has the fevers. She reports a mild headache off and on and some sinus congestion. She has had a cough which is somewhat productive of sputum when she is able to use her incentive spirometer. She has had no chest pain. She does have some mild nausea, but no vomiting or abdominal pain. She said she was constipated but has been on stool softeners and is now having loose stools. She has no joint swelling or new inflammatory arthritis. She has no rash. She has no dysuria. She has an IV site in her left arm and had had one in her right arm, but is not tender at that old site.   PAST MEDICAL HISTORY:  1. 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  2. Depression.  3. Hypertension.  4. Viral transverse myelitis in 1989.  5. Hyperlipidemia.   PAST SURGICAL HISTORY:  1. Tubal ligation in 1986.  2. InterStim bladder.  3. Right total hip replacement  09/12/2012   SOCIAL HISTORY: The patient is disabled. She lives with her husband. She does not smoke or drink.   FAMILY HISTORY: Positive for father who died of congestive heart failure at 65. Mother with stroke at 16 and a brother with cirrhosis and a sister with multiple sclerosis.   REVIEW OF SYSTEMS: Eleven systems reviewed and negative except as per history of present illness.   CURRENT MEDICATIONS:  1. Zosyn 3.375 mg IV q. eight hours. This was begun on 11/10 in the p.m.  2. Carbamazepine 100 mg twice a day. This is one of her outpatient medications.  3. Dantrolene 100 mg 3 times daily, also an outpatient medication.  4. Colace.  5. Nexium 40 once a day.  6. Lasix 40 once a day.  7. Gabapentin 800 mg three times a day.  8. Lisinopril 20 mg once a day.  9. Metoclopramide 10 mg 4 times a day.  10. Pravastatin 40 mg once a day.  11. Zantac 150 mg once a day.  12. Zoloft 100 mg orally.  13. Xarelto 10 mg once a day.  14. Insulin Lantus 30 units in the evening. 15. NovoLog 15 units with each meal. 16. Sliding scale insulin.   ALLERGIES: The patient is listed as being allergic to Zetia and aloe vera latex gloves.   PHYSICAL EXAMINATION:    GENERAL: The patient is a pleasant interactive female sitting in bed in no acute distress.   VITAL  SIGNS: T-max over the last 24 hours is 101.7 on 11/12 at 10:00 a.m. The day prior on the 11th her T-max was 100.8, on the 10th it was 100.3, on the 9th it was 101.5. Pulse is 81, blood pressure 111/56, pulse oximetry 93% on 1 liter.   HEENT: Pupils are equal, round, and reactive to light and accommodation. Extraocular movements are intact. Sclerae are anicteric. Oropharynx is clear without any thrush. She has dentures.   NECK: Supple. There is no anterior cervical, posterior cervical, or supraclavicular lymphadenopathy.   HEART: Regular but somewhat distant. No murmurs noted.   LUNGS: Crackles bibasilarly.   ABDOMEN: Soft, nondistended, obese,  nontender to palpation.   EXTREMITIES: She has no edema. However, she does have some tenderness posteriorly on her calf on the left. She does have thin, wasted limbs.   SKIN: Her surgical site on the right hip is dressed. It is minimally tender to palpation. Per report, there is some serous drainage. There is some induration surrounding it, but no marked erythema.   NEUROLOGIC: She is alert, interactive, able to move all four extremities. Cranial nerves are intact.   SKIN: She has no other rashes noted.   LABORATORY, DIAGNOSTIC AND RADIOLOGICAL DATA: Most recent white blood count done on 11/08 reveals a white count of 11.0, hemoglobin 8.8, platelets 176. There were 8.3 thousand neutrophils. She had follow-up hemoglobin decreased down to 7.9 and was transfused. Basic metabolic panel on 38/18 revealed a BUN of 11, creatinine 0.82. Otherwise was normal. ESR done on 11/10 was greater than 140. Blood cultures 11/10 x2 were no growth to date. Urinalysis done on 11/10 was normal with only two white blood cells. Chest x-ray done 11/10 showed no acute disease of the chest. CT of the right thigh done on 11/11 revealed some subcutaneous thickening anterior to the right hip, which is likely postoperative. There were a few small foci of air, also likely postoperative. There was no discrete fluid collection. There was a stimulator device in the sacrum. CT of the head done 11/12 revealed no acute abnormality.   IMPRESSION/RECOMMENDATIONS: A 69 year old now postoperative day five from right total hip replacement for severe osteoarthritis. She has had fevers since 11/09. Blood cultures and urinalysis have been negative as have chest x-ray. CT of the thigh shows some subcutaneous thickening, which is likely postoperative, but no fluid collections. Her sedimentation rate is markedly elevated. She also does have some calf tenderness on the left, but has been on Xarelto for deep vein thrombosis prophylaxis.   Differential  diagnosis would include wound infection or deeper infection in her thigh, pneumonia, deep vein thrombosis, drug fever, gouty flare, or C. difficile.   Today, we will check Doppler's of the lower extremity. We will check a sputum culture. Repeat an ESR and check a CBC and C-MET. We will continue her on the Zosyn for now. However, if the work-up is negative, we may consider stopping this as it is unclear what we are treating. We will also try to check a sputum culture if she can produce it.   Thank you for the consult. Will be glad to follow along with you.    ____________________________ Cheral Marker. Ola Spurr, MD dpf:ap D: 09/17/2012 13:19:21 ET T: 09/17/2012 14:00:37 ET JOB#: [id number]  cc: Cheral Marker. Ola Spurr, MD, <Dictator> Jahari Billy Ola Spurr MD ELECTRONICALLY SIGNED 09/25/2012 19:56

## 2015-02-23 NOTE — Discharge Summary (Signed)
PATIENT NAME:  Rebecca Castro, Rebecca Castro MR#:  [id number] DATE OF BIRTH:  11-16-1945  DATE OF ADMISSION:  09/12/2012 DATE OF DISCHARGE:  09/23/2012  ADMITTING DIAGNOSIS:  Right total hip arthroplasty.  DISCHARGE DIAGNOSIS:  Right total hip arthroplasty.  OPERATION:  On 09/12/2012 Mrs. Renken had an anterior approach right total hip arthroplasty.  On 09/18/2012 she had I and D of her right hip for possible hip infection. (Anesthesia general, estimated blood loss 50 ml, multiple cultures sent, wound VAC placed, complications none).  SURGEON:  Dr. Kennedy Bucker  ANESTHESIA:  General.  ESTIMATED BLOOD LOSS:  200 mL.  SPECIMEN:  Femoral head.  DRAINS:  Hemovac.  COMPLICATIONS:  None.  IMPLANTS USED:  One AMIStem, 46-mm DM cup, S head, and 46-mm liner.   The patient was stabilized, brought to the recovery room, and then brought down to the orthopedic floor where she was treated by physical therapy and for pain control.    HISTORY:  Mrs. Henken  is a 69 year old African American female with right hip osteoarthritis.  After failing conservative measures she elected for a right total hip arthroplasty.  She was admitted on 09/12/2012 and underwent right anterior approach total hip arthroplasty.  She at baseline has a viral transverse myelitis since 1989 and is able to stand for transfers and walks only around her home.  When in public she uses a walker. She takes hydrocodone/acetaminophen 7.5/500, four pills a day, approximately one tablet q. 6.  She also takes Aleve and has not done physical therapy.  She tried one injection in her hip which did not help.  PHYSICAL EXAMINATION:  Well-developed, well-nourished, alert and oriented times three. CARDIOVASCULAR: Heart- regular rate and rhythm.  Respiratory- lung sounds clear throughout.  Chest- expansion symmetric bilaterally.  Abdomen- no hepatomegaly.  No splenomegaly. Nontender to palpation throughout the entire abdomen.  Positive bowel sounds in all four quadrants.   Cardiovascular- capillary refill brisk.  No edema in the right lower extremity.  Musculoskeletal- in a wheelchair in the clinic.  Refusing to weight bear. Pain is reproduced with hip internal/external rotation or flexion.  She is mildly tender to palpation in the right greater trochanter and in the right groin.  Knee range of motion equal and full. Skin- no scars, rashes, or lesions.   HOSPITAL COURSE:   After admission on 09/12/2012, the patient underwent a right total hip arthroplasty. On postoperative day #1 she had fevers overnight, T-max 102.9.  Hemoglobin was 9.4.  Moderate pain.  She was brought to the orthopedic floor from the recovery room. On postoperative day #2 she continued to have fevers, T-max 99.6 but was confused.  Urinalysis was negative for nitrites, less than 1 WBC, trace bacteria.  Hemoglobin was 8.2.  Tylenol was given for fevers.  On postoperative day #3, 09/15/2012, she continued with confusion. Urinalysis showed negative nitrites, 2 WBC/HPF, trace bacteria, and mucus present.  T-max 99.5.  Hemoglobin 7.9 and was transfused one unit of packed red cells.  She continued Tylenol for pain relief and fevers and narcotics were held due to confusion.  Medicine was also consulted on 09/15/2012 for her fevers.  Blood and urine cultures are pending and she was started on Zosyn.  C-reactive protein was elevated and CT scan of the right hip was advised.  On postoperative day #4, 09/16/2012, her confusion continued to improve.  Hemoglobin was 9.4.  T-max 99.8.  CT scan was pending and she was having slow progress with physical therapy.  On postop day #5,  09/17/2012, CT revealed no fluid collection.  Hip wound had clear serous drainage.  The decision was made to continue physical therapy and cancel n.p.o. status if okay with medicine. Dr. Clydie Braun was also consulted for postoperative fever of unknown origin. Blood cultures, urinalysis, and chest x-ray were negative.  Lower extremity Doppler was  performed and a sputum culture was recommended.  She was not confused at all on 09/17/2012. Temperature 99.4.   Planned for a CT of the head to further evaluate confusion.  On postop day #6, 09/18/2012,  T-max 99.3.  Sed rate was elevated at 140.  WBC 10.4.  She continued slow progress with physical therapy.  The decision was made for irrigation and debridement of her right hip considering her fevers and confusion.   On 09/18/2012 she had I and D for possible right hip infection.  Wound VAC was applied and cultures were sent.  She has continued to do well since her I and D.  On 09/19/2012 no confusion, no fevers, temperature 98.7.  PICC line was placed to continue antibiotics.  Continued pain management with Tylenol.  On 09/20/2012, postop day #8 for her right total hip and #2 for her right I and D, she continued to do well with no confusion and no fevers and progressing slowly with physical therapy.   On 09/21/2012, postop day #9 for right total hip and postop day #3 for right hip I and D, no confusion and no fevers. Pain is well controlled.   On 09/22/2012, postop day #10 for right total hip and postop day #4 right hip I and D, no confusion and no fevers.  Temperature 98.3.  The patient was stable and ready to go to rehab on 09/23/2012.    CONDITION AT DISCHARGE:  Stable.  DISPOSITION:  The patient was sent to [facility].  DISCHARGE INSTRUCTIONS: 1. The patient will follow up at [facility] orthopedics in three to four weeks for wound check. 2. She will be transferred to [facility], who will do wound VAC dressing changes every 48 hours. 3. She will continue physical therapy and may be weightbearing as tolerated on the right lower extremity.   4. Knee-high TED hose will be used.   5. The PICC line in the left upper extremity should be flushed per facility protocol.  DISCHARGE MEDICATIONS: 1. Sertraline 100 mg, 1 tab p.o. daily. 2. Ranitidine 150 mg, 1 tab p.o. at bedtime. 3. Pravastatin 40 mg,  1 tab p.o. at bedtime. 4. Novolin insulin 15 units subq t.i.d. 5. Esomeprazole 40 mg p.o. daily. 6. Metoclopramide 10 mg, 1 tab q.i.d. 7. Lisinopril 20 mg, 1 tab daily. 8. Insulin glargine 30 units subq at bedtime. 9. Gabapentin 800 mg p.o. t.i.d. 10. Dantrolene 100 mg p.o. t.i.d. 11. Diphenhydramine 25 mg p.o. at bedtime. 12. Insulin sliding scale Novolin R subq, FSBS before meals and at bedtime.  If FSBS is 0-200, no insulin, 4 units if 201-250, 6 units if 251-300, 8 units if 301-350, 10 units if 351-400.  Call MD if greater than 400.  P.r.n. medicines include:  1. Acetaminophen 500 mg, 1-2 tabs p.o. q. 4 hours p.r.n. for temperature greater than 100.4 or pain. 2. Oxycodone 5 mg, 1-2 tabs p.o. q. 4 hours p.r.n. pain. 3. Baclofen 20 mg, 1 tab p.o. q. 6 hours p.r.n. spasm. 4. Furosemide 40 mg, 1 tab p.o. p.r.n. edema.  Her PICC line will be flushed per facility protocol.    ____________________________ Thai Burgueno M. Haskel Khan, NP amb:bjt D: 09/23/2012  12:24:28 ET T: 09/23/2012 13:09:00 ET JOB#: [id number]  cc: Jiro Kiester M. Haskel KhanBerndt, NP, <Dictator> Burt EkAPRIL M Tushar Enns FNP ELECTRONICALLY SIGNED 10/01/2012 15:42

## 2015-02-23 NOTE — Op Note (Signed)
PATIENT NAME:  Rebecca Castro, Rebecca Castro MR#:  [id number] DATE OF BIRTH:  September 24, 1946  DATE OF PROCEDURE:  09/12/2012  PREOPERATIVE DIAGNOSIS: Severe right hip osteoarthritis.   POSTOPERATIVE DIAGNOSIS: Severe right hip osteoarthritis.   PROCEDURE: Right total hip replacement.   SURGEON: Kennedy BuckerMichael Danish Ruffins, MD  ANESTHESIA: General.   DESCRIPTION OF PROCEDURE: The patient was brought to the Operating Room and after adequate anesthesia was obtained the right leg was placed in the traction boot for the Medacta frame with the left leg in the well legholder and preop x-ray obtained. After prepping and draping in the usual sterile fashion, and appropriate patient identification and time out procedures were carried out, an anterior incision was then made in line with the tensor fascia, the subcutaneous tissue was spread, the tensor incised and the tensor muscle retracted laterally. The deep fascia was then incised getting down to the rectus fascia. This was incised and retracted medially. The lateral femoral circumflex artery was then cauterized and the anterior capsule exposed. A capsulotomy was carried out and then the femoral neck cut made with C-arm being used to help align this.  After this had been completed, the femoral head was removed. This was with some difficulty with a little bit of protrusio defect and there was some osteophytes around the acetabulum which had overgrown the entrance to the acetabular cuff. These were removed with use of rongeur and the head was removed. There was eburnated bone present on the head and inspection of the cuff revealed sclerotic bone, no cartilage remaining. First with the modified Charnley retractor placed, the acetabulum was sequentially reamed to 46 mm. This gave bleeding bone and was deep enough to be adequate. The 46 mm trial fit well and a 46 mm cup was then impacted into position. Next, the hip was externally rotated and the leg extended with release of the posterior capsule,  sequential broaching was carried out, and again with the C-arm being used to assess position and size; a #1 stem was actually quite tight. With the trials placed with intraoperative pictures being printed, it appeared that the short stem gave appropriate leg length. The #1 stem was then impacted down the canal followed by application of the bipolar head, impacted, and then reducing the hip. The hip appeared stable with appropriate soft tissue tension. X-ray showed restoration of length. The wound was thoroughly irrigated. The capsule was repaired using a #1 Ethibond. Marcaine and morphine were injected into the soft tissues around the joint to aid in postoperative analgesia. The wound was thoroughly irrigated. The deep fascia was repaired using a running heavy quill suture. A subcutaneous Hemovac drain was placed followed by 2-0 quill subcutaneously followed by skin staples. Xeroform, 4 x 4's, ABD, and tape were applied.   ESTIMATED BLOOD LOSS: 200 mL.   COMPLICATIONS: None.   SPECIMENS: Femoral head.   IMPLANTS: Medacta Amis stem size 1 standard, a 46 mm Versafit cup DM with a 46 mm liner, and a size S femoral head, 22.2 mm in diameter.  ____________________________ Leitha SchullerMichael J. Elizebath Wever, MD mjm:slb D: 09/12/2012 22:22:47 ET T: 09/13/2012 11:01:34 ET JOB#: [id number]  cc: Leitha SchullerMichael J. Winn Muehl, MD, <Dictator> Leitha SchullerMICHAEL J Tyberius Ryner MD ELECTRONICALLY SIGNED 09/13/2012 12:32

## 2015-02-23 NOTE — Op Note (Signed)
PATIENT NAME:  Rebecca Castro, Shandrea P MR#:  [id number] DATE OF BIRTH:  21-Feb-1946  DATE OF PROCEDURE:  09/18/2012  PREOPERATIVE DIAGNOSIS: Possible right hip infection.  POSTOPERATIVE DIAGNOSIS: Possible right hip infection.   PROCEDURE: Incision and drainage right hip.   ANESTHESIA: General.   SURGEON: Leitha SchullerMichael J. Lirio Bach, MD    DESCRIPTION OF PROCEDURE: The patient was brought to the operating room and after adequate anesthesia was obtained the prior staples were removed. The anterior hip was then prepped and draped in the usual sterile fashion and appropriate patient identification and time-out procedures were completed. The prior subcutaneous sutures were removed and the subcutaneous tissue opened. There was edema present. No evidence of gross purulence. A culture was taken of the superficial layer. Next, THE deep muscular fascia suture was removed and deep cultures taken down to the hip joint again without gross purulent material being in evidence. Two sets of deep cultures were obtained. Following this, the hip was irrigated with 6 liters via pulsatile lavage. After thorough irrigation of the joint along with the implants that were visible, the superficial fascia was then loosely repaired and a Wound VAC applied with silver foam. With a Wound VAC applied, the patient was sent to the recovery room in stable condition.   ESTIMATED BLOOD LOSS: 50 mL.   COMPLICATIONS: None.   SPECIMEN: Three cultures, right hip.   CONDITION: To recovery room stable.   ____________________________ Leitha SchullerMichael J. Cariah Salatino, MD mjm:drc D: 09/18/2012 23:08:33 ET T: 09/19/2012 10:15:29 ET JOB#: [id number] Nolon BussingMICHAEL J Blessing Ozga MD ELECTRONICALLY SIGNED 09/19/2012 13:24

## 2015-02-23 NOTE — Op Note (Signed)
PATIENT NAME:  Rebecca Castro, Rebecca Castro MR#:  [id number] DATE OF BIRTH:  [birth year]  DATE OF PROCEDURE:  09/19/2012  PREOPERATIVE DIAGNOSIS: Right hip infection with need for extended IV antibiotics.   POSTOPERATIVE DIAGNOSIS: Right hip infection with need for extended IV antibiotics.   PROCEDURES:  1. Ultrasound guidance for vascular access to left brachial vein.  2. Fluoroscopic guidance for placement of catheter.  3. Insertion of peripherally inserted central venous catheter, double lumen, left arm.  SURGEON: Annice NeedyJason S. Deforest Maiden, MD  ANESTHESIA: Local.   ESTIMATED BLOOD LOSS: Minimal.   INDICATION FOR PROCEDURE: 69 year old female with recent orthopedic surgery for a hip infection. She will require IV antibiotics ongoing and we are asked to place a PICC line.   DESCRIPTION OF PROCEDURE: The patient's left arm was sterilely prepped and draped, and a sterile surgical field was created. The left brachial vein was accessed under direct ultrasound guidance without difficulty with a micropuncture needle and permanent image was recorded. 0.018 wire was then placed into the superior vena cava. Peel-away sheath was placed over the wire. A single lumen peripherally inserted central venous catheter was then placed over the wire and the wire and peel-away sheath were removed. The catheter tip was placed into the superior vena cava and was secured at the skin at 43 cm with a sterile dressing. The catheter withdrew blood well and flushed easily with heparinized saline. The patient tolerated procedure well.  ____________________________ Annice NeedyJason S. Rhiann Boucher, MD jsd:cms D: 09/20/2012 12:33:00 ET T: 09/20/2012 13:02:20 ET JOB#: [id number] cc: Annice NeedyJason S. Arthuro Canelo, MD, <Dictator> Annice NeedyJASON S Krupa Stege MD ELECTRONICALLY SIGNED 09/23/2012 9:05
